# Patient Record
Sex: Female | Born: 1975 | Race: White | Hispanic: No | Marital: Married | Smoking: Former smoker
Health system: Southern US, Community
[De-identification: ages and names within clinical notes are randomized; demographics above are authoritative.]

## PROBLEM LIST (undated history)

## (undated) DIAGNOSIS — N809 Endometriosis, unspecified: Secondary | ICD-10-CM

## (undated) HISTORY — DX: Endometriosis, unspecified: N80.9

---

## 2007-03-25 ENCOUNTER — Ambulatory Visit: Payer: Self-pay | Admitting: Unknown Physician Specialty

## 2007-04-01 ENCOUNTER — Ambulatory Visit: Payer: Self-pay | Admitting: Unknown Physician Specialty

## 2007-04-07 ENCOUNTER — Ambulatory Visit: Payer: Self-pay | Admitting: Unknown Physician Specialty

## 2007-12-01 ENCOUNTER — Ambulatory Visit: Payer: Self-pay | Admitting: Unknown Physician Specialty

## 2008-06-08 HISTORY — PX: ABDOMINAL HYSTERECTOMY: SHX81

## 2009-03-27 ENCOUNTER — Ambulatory Visit: Payer: Self-pay | Admitting: Unknown Physician Specialty

## 2009-04-03 ENCOUNTER — Ambulatory Visit: Payer: Self-pay | Admitting: Unknown Physician Specialty

## 2010-06-08 LAB — HM MAMMOGRAPHY: HM Mammogram: NORMAL

## 2011-08-10 ENCOUNTER — Ambulatory Visit: Payer: Self-pay | Admitting: Internal Medicine

## 2011-08-14 ENCOUNTER — Emergency Department: Payer: Self-pay | Admitting: Emergency Medicine

## 2011-08-14 LAB — CBC WITH DIFFERENTIAL/PLATELET
Basophil %: 0.3 %
Eosinophil #: 0.1 10*3/uL (ref 0.0–0.7)
HGB: 13.6 g/dL (ref 12.0–16.0)
MCH: 31.7 pg (ref 26.0–34.0)
MCHC: 34.8 g/dL (ref 32.0–36.0)
MCV: 91 fL (ref 80–100)
Monocyte #: 0.4 10*3/uL (ref 0.0–0.7)
Neutrophil %: 52.6 %
Platelet: 284 10*3/uL (ref 150–440)

## 2011-08-14 LAB — COMPREHENSIVE METABOLIC PANEL
Alkaline Phosphatase: 94 U/L (ref 50–136)
BUN: 9 mg/dL (ref 7–18)
Bilirubin,Total: 1.1 mg/dL — ABNORMAL HIGH (ref 0.2–1.0)
Chloride: 105 mmol/L (ref 98–107)
Creatinine: 0.78 mg/dL (ref 0.60–1.30)
EGFR (African American): >60
Glucose: 88 mg/dL (ref 65–99)
Osmolality: 281 (ref 275–301)
SGPT (ALT): 62 U/L
Sodium: 142 mmol/L (ref 136–145)
Total Protein: 7.3 g/dL (ref 6.4–8.2)

## 2011-08-15 LAB — URINALYSIS, COMPLETE
Bacteria: NONE SEEN
Bilirubin,UR: NEGATIVE
Glucose,UR: NEGATIVE mg/dL (ref 0–75)
Ketone: NEGATIVE
Nitrite: NEGATIVE
Ph: 6 (ref 4.5–8.0)
RBC,UR: 2 /HPF (ref 0–5)
Squamous Epithelial: 4
WBC UR: 3 /HPF (ref 0–5)

## 2011-10-12 ENCOUNTER — Ambulatory Visit: Payer: Self-pay | Admitting: Gastroenterology

## 2013-01-17 ENCOUNTER — Ambulatory Visit: Payer: Self-pay | Admitting: Family Medicine

## 2013-01-17 LAB — CBC WITH DIFFERENTIAL/PLATELET
Basophil #: 0 10*3/uL (ref 0.0–0.1)
Eosinophil #: 0.1 10*3/uL (ref 0.0–0.7)
Eosinophil %: 1.3 %
HCT: 41.1 % (ref 35.0–47.0)
HGB: 14.3 g/dL (ref 12.0–16.0)
MCH: 31.4 pg (ref 26.0–34.0)
MCHC: 34.7 g/dL (ref 32.0–36.0)
MCV: 90 fL (ref 80–100)
Monocyte #: 0.3 x10 3/mm (ref 0.2–0.9)
Monocyte %: 4.7 %
Neutrophil #: 3.8 10*3/uL (ref 1.4–6.5)
Neutrophil %: 59.1 %
Platelet: 309 10*3/uL (ref 150–440)
WBC: 6.4 10*3/uL (ref 3.6–11.0)

## 2013-01-17 LAB — COMPREHENSIVE METABOLIC PANEL
Albumin: 4.5 g/dL (ref 3.4–5.0)
Anion Gap: 7 (ref 7–16)
Bilirubin,Total: 1 mg/dL (ref 0.2–1.0)
Chloride: 103 mmol/L (ref 98–107)
EGFR (African American): >60
Glucose: 101 mg/dL — ABNORMAL HIGH (ref 65–99)
Osmolality: 279 (ref 275–301)
Potassium: 4.4 mmol/L (ref 3.5–5.1)
SGOT(AST): 24 U/L (ref 15–37)
SGPT (ALT): 59 U/L (ref 12–78)
Total Protein: 8 g/dL (ref 6.4–8.2)

## 2013-01-17 LAB — URINALYSIS, COMPLETE
Bacteria: NEGATIVE
Bilirubin,UR: NEGATIVE
Blood: NEGATIVE
Glucose,UR: NEGATIVE mg/dL (ref 0–75)
Ketone: NEGATIVE
Nitrite: NEGATIVE

## 2013-01-17 LAB — AMYLASE: Amylase: 30 U/L (ref 25–115)

## 2013-01-17 LAB — LIPASE, BLOOD: Lipase: 87 U/L (ref 73–393)

## 2014-06-11 ENCOUNTER — Ambulatory Visit: Payer: Self-pay | Admitting: Physician Assistant

## 2014-09-05 LAB — HEMOGLOBIN A1C: Hgb A1c MFr Bld: 5.8 % (ref 4.0–6.0)

## 2014-09-27 ENCOUNTER — Encounter: Payer: Self-pay | Admitting: Internal Medicine

## 2014-09-27 DIAGNOSIS — E8940 Asymptomatic postprocedural ovarian failure: Secondary | ICD-10-CM | POA: Insufficient documentation

## 2014-09-27 DIAGNOSIS — F17201 Nicotine dependence, unspecified, in remission: Secondary | ICD-10-CM | POA: Insufficient documentation

## 2014-09-27 DIAGNOSIS — R7303 Prediabetes: Secondary | ICD-10-CM | POA: Insufficient documentation

## 2014-09-27 HISTORY — DX: Asymptomatic postprocedural ovarian failure: E89.40

## 2014-11-27 ENCOUNTER — Ambulatory Visit (INDEPENDENT_AMBULATORY_CARE_PROVIDER_SITE_OTHER): Payer: BLUE CROSS/BLUE SHIELD | Admitting: Internal Medicine

## 2014-11-27 ENCOUNTER — Other Ambulatory Visit: Payer: Self-pay

## 2014-11-27 ENCOUNTER — Encounter: Payer: Self-pay | Admitting: Internal Medicine

## 2014-11-27 VITALS — BP 110/70 | HR 72 | Ht 66.0 in | Wt 254.4 lb

## 2014-11-27 DIAGNOSIS — R7309 Other abnormal glucose: Secondary | ICD-10-CM

## 2014-11-27 DIAGNOSIS — Z0000 Encounter for general adult medical examination without abnormal findings: Secondary | ICD-10-CM

## 2014-11-27 DIAGNOSIS — R7303 Prediabetes: Secondary | ICD-10-CM

## 2014-11-27 DIAGNOSIS — E8940 Asymptomatic postprocedural ovarian failure: Secondary | ICD-10-CM | POA: Diagnosis not present

## 2014-11-27 DIAGNOSIS — Z1211 Encounter for screening for malignant neoplasm of colon: Secondary | ICD-10-CM | POA: Diagnosis not present

## 2014-11-27 LAB — POCT URINALYSIS DIPSTICK
Bilirubin, UA: NEGATIVE
Blood, UA: NEGATIVE
Glucose, UA: NEGATIVE
Ketones, UA: NEGATIVE
Leukocytes, UA: NEGATIVE
Nitrite, UA: NEGATIVE
Protein, UA: NEGATIVE
Spec Grav, UA: <=1.005
Urobilinogen, UA: 0.2
pH, UA: 5

## 2014-11-27 MED ORDER — NAPROXEN 250 MG PO TABS: 250.0000 mg | ORAL_TABLET | Freq: Two times a day (BID) | ORAL | Status: DC | PRN

## 2014-11-27 NOTE — Patient Instructions (Signed)

## 2014-11-27 NOTE — Progress Notes (Signed)
Date:  11/27/2014   Name:  Amber Adkins   DOB:  12-10-1975   MRN:  [id number]   Chief Complaint: Annual Exam and Diabetes Amber Adkins is a 39 y.o. female who presents today for her Complete Annual Exam. She feels well. She reports exercising -going to the gym regularly. She reports she is sleeping fairly well. She denies any breast symptoms but is not doing self exams.   Review of Systems:  Review of Systems  Patient Active Problem List   Diagnosis Date Noted  . Borderline diabetes 09/27/2014  . 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 Current tobacco use 09/27/2014    Prior to Admission medications   Medication Sig Start Date End Date Taking? Authorizing Provider  B-D UF III MINI PEN NEEDLES 31G X 5 MM MISC USE DAILY WITH VICTOZA 10/02/14  Yes Historical Provider, MD  estradiol (ESTRACE) 1 MG tablet Take 1 tablet by mouth daily. 09/17/14  Yes Historical Provider, MD  Liraglutide 18 MG/3ML SOPN Inject 1.8 mg into the skin daily. 09/17/14  Yes Historical Provider, MD  naproxen (NAPROSYN) 250 MG tablet Take 1 tablet by mouth 2 (two) times daily as needed.   Yes Historical Provider, MD    No Known Allergies  Past Surgical History  Procedure Laterality Date  . Abdominal hysterectomy  2010    total    History  Substance Use Topics  . Smoking status: Former Smoker -- 2.00 packs/day for 15 years    Types: Cigarettes    Quit date: 06/08/2013  . Smokeless tobacco: Not on file  . Alcohol Use: 0.0 oz/week    0 Standard drinks or equivalent per week     Comment: social     Medication list has been reviewed and updated.  Physical Examination:  Physical Exam  Constitutional: She is oriented to person, place, and time. She appears well-developed. No distress.  HENT:  Head: Normocephalic and atraumatic.  Eyes: Conjunctivae are normal. Right eye exhibits no discharge. Left eye exhibits no discharge. No scleral icterus.  Neck: Normal range of motion. Neck  supple. No thyromegaly present.  Cardiovascular: Normal rate, regular rhythm, normal heart sounds and intact distal pulses.   No murmur heard. Pulmonary/Chest: Effort normal and breath sounds normal. No respiratory distress. She has no wheezes.  Abdominal: Soft. Bowel sounds are normal. There is no tenderness. There is no rebound and no guarding.  Genitourinary: No breast swelling, tenderness, discharge or bleeding. Pelvic exam was performed with patient supine.  Musculoskeletal: Normal range of motion. She exhibits edema (trace pre-tibial edema bilaterally). She exhibits no tenderness.  Lymphadenopathy:    She has no cervical adenopathy.  Neurological: She is alert and oriented to person, place, and time. She has normal reflexes.  Skin: Skin is warm and dry. No rash noted.  Psychiatric: She has a normal mood and affect. Her behavior is normal. Thought content normal.    BP 110/70 mmHg  Pulse 72  Ht  (1.676 m)  Wt 254 lb 6.4 oz (115.395 kg)  BMI 41.08 kg/m2  Assessment and Plan: 1. Annual physical exam Normal except for weight Discussed diet and exercise  Reduce sodium in diet to help reduce mild edema - POCT urinalysis dipstick - CBC with Differential/Platelet - TSH  2. Borderline diabetes Finish current supply of Victoza then discontinue - Comprehensive metabolic panel - Hemoglobin A1c - Lipid panel  3. Failed, ovarian, postablative On HRT with control of symptoms  4. Colon cancer screening - Fecal  Occult Blood, Guaiac   Bari Edward, MD [facility]  11/27/2014

## 2014-12-03 ENCOUNTER — Telehealth: Payer: Self-pay | Admitting: Internal Medicine

## 2014-12-03 NOTE — Telephone Encounter (Signed)
Left message to call.dr 

## 2014-12-12 ENCOUNTER — Other Ambulatory Visit: Payer: Self-pay | Admitting: Internal Medicine

## 2014-12-12 DIAGNOSIS — E119 Type 2 diabetes mellitus without complications: Secondary | ICD-10-CM

## 2014-12-13 ENCOUNTER — Other Ambulatory Visit: Payer: Self-pay

## 2014-12-13 DIAGNOSIS — E119 Type 2 diabetes mellitus without complications: Secondary | ICD-10-CM

## 2014-12-14 LAB — CBC WITH DIFFERENTIAL/PLATELET
Basophils Absolute: 0 10*3/uL (ref 0.0–0.2)
Basos: 0 %
EOS (ABSOLUTE): 0.1 10*3/uL (ref 0.0–0.4)
EOS: 1 %
HEMOGLOBIN: 12.9 g/dL (ref 11.1–15.9)
Hematocrit: 38.7 % (ref 34.0–46.6)
IMMATURE GRANS (ABS): 0 10*3/uL (ref 0.0–0.1)
Immature Granulocytes: 0 %
LYMPHS ABS: 1.9 10*3/uL (ref 0.7–3.1)
Lymphs: 32 %
MCH: 30.4 pg (ref 26.6–33.0)
MCHC: 33.3 g/dL (ref 31.5–35.7)
MCV: 91 fL (ref 79–97)
Monocytes Absolute: 0.4 10*3/uL (ref 0.1–0.9)
Monocytes: 6 %
NEUTROS ABS: 3.7 10*3/uL (ref 1.4–7.0)
NEUTROS PCT: 61 %
Platelets: 295 10*3/uL (ref 150–379)
RBC: 4.25 x10E6/uL (ref 3.77–5.28)
RDW: 13.5 % (ref 12.3–15.4)
WBC: 6 10*3/uL (ref 3.4–10.8)

## 2014-12-14 LAB — COMPREHENSIVE METABOLIC PANEL
A/G RATIO: 1.9 (ref 1.1–2.5)
ALBUMIN: 4.2 g/dL (ref 3.5–5.5)
ALT: 30 IU/L (ref 0–32)
AST: 15 IU/L (ref 0–40)
Alkaline Phosphatase: 71 IU/L (ref 39–117)
BUN/Creatinine Ratio: 20 (ref 8–20)
BUN: 14 mg/dL (ref 6–20)
Bilirubin Total: 1 mg/dL (ref 0.0–1.2)
CALCIUM: 9.4 mg/dL (ref 8.7–10.2)
CO2: 26 mmol/L (ref 18–29)
Chloride: 101 mmol/L (ref 97–108)
Creatinine, Ser: 0.71 mg/dL (ref 0.57–1.00)
GFR calc Af Amer: 125 mL/min/{1.73_m2} (ref >59)
GFR calc non Af Amer: 108 mL/min/{1.73_m2} (ref >59)
GLUCOSE: 107 mg/dL — AB (ref 65–99)
Globulin, Total: 2.2 g/dL (ref 1.5–4.5)
Potassium: 4.2 mmol/L (ref 3.5–5.2)
Sodium: 140 mmol/L (ref 134–144)
TOTAL PROTEIN: 6.4 g/dL (ref 6.0–8.5)

## 2014-12-14 LAB — HEMOGLOBIN A1C
Est. average glucose Bld gHb Est-mCnc: 120 mg/dL
Hgb A1c MFr Bld: 5.8 % — ABNORMAL HIGH (ref 4.8–5.6)

## 2014-12-14 LAB — LIPID PANEL
Chol/HDL Ratio: 4.8 ratio units — ABNORMAL HIGH (ref 0.0–4.4)
Cholesterol, Total: 218 mg/dL — ABNORMAL HIGH (ref 100–199)
HDL: 45 mg/dL (ref >39)
LDL Calculated: 122 mg/dL — ABNORMAL HIGH (ref 0–99)
Triglycerides: 256 mg/dL — ABNORMAL HIGH (ref 0–149)
VLDL Cholesterol Cal: 51 mg/dL — ABNORMAL HIGH (ref 5–40)

## 2014-12-14 LAB — TSH: TSH: 1.16 u[IU]/mL (ref 0.450–4.500)

## 2015-02-18 ENCOUNTER — Ambulatory Visit (INDEPENDENT_AMBULATORY_CARE_PROVIDER_SITE_OTHER): Payer: BLUE CROSS/BLUE SHIELD | Admitting: Internal Medicine

## 2015-02-18 ENCOUNTER — Encounter: Payer: Self-pay | Admitting: Internal Medicine

## 2015-02-18 VITALS — BP 130/68 | HR 80 | Ht 66.0 in | Wt 257.0 lb

## 2015-02-18 DIAGNOSIS — E8940 Asymptomatic postprocedural ovarian failure: Secondary | ICD-10-CM

## 2015-02-18 DIAGNOSIS — Z720 Tobacco use: Secondary | ICD-10-CM

## 2015-02-18 DIAGNOSIS — Z6841 Body Mass Index (BMI) 40.0 and over, adult: Secondary | ICD-10-CM | POA: Diagnosis not present

## 2015-02-18 DIAGNOSIS — R7303 Prediabetes: Secondary | ICD-10-CM

## 2015-02-18 DIAGNOSIS — R7309 Other abnormal glucose: Secondary | ICD-10-CM

## 2015-02-18 DIAGNOSIS — F17201 Nicotine dependence, unspecified, in remission: Secondary | ICD-10-CM

## 2015-02-18 NOTE — Patient Instructions (Signed)
Recommend Bariatric surgery seminar for options for surgery.

## 2015-02-18 NOTE — Progress Notes (Signed)
Date:  02/18/2015   Name:  Amber Adkins   DOB:  Feb 20, 1976   MRN:  [id number]   Chief Complaint: Diabetes  prediabetes -  Patient was treated with thick toes that help with prediabetes and weight loss. She discontinued it after several months when she didn't have any benefit..  Obesity -  Patient is very frustrated with her inability to lose weight. She did quit smoking gained about 30 pounds. Immediately began exercising and eating healthy cutting out sodas both regular and diet. Regardless she's not been able to lose any weight. She is considering bariatric clinic for phentermine and hCG injections. However she is not excited about this possibility and is wondering if she should proceed to bariatric surgery.  Hormone replacement therapy -  Patient had a total hysterectomy at a young age and has been on estrogen since that time. She's been edgy and unhappy for the past 6 months and she believes this might be due to hormone treatment. Is wondering about the different types of estrogen therapy. She also believes a lot of her emotional issues are tied to her inability to lose weight.  Review of Systems:  Review of Systems  Constitutional: Positive for fatigue. Negative for fever, chills, diaphoresis and unexpected weight change.  Respiratory: Negative for choking, chest tightness and shortness of breath.   Cardiovascular: Negative for chest pain, palpitations and leg swelling.  Gastrointestinal: Negative for abdominal pain.  Endocrine: Negative for polyphagia and polyuria.  Neurological: Negative for headaches.  Psychiatric/Behavioral: Positive for dysphoric mood. Negative for sleep disturbance and self-injury.    Patient Active Problem List   Diagnosis Date Noted  . Borderline diabetes 09/27/2014  . 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 Current tobacco use 09/27/2014    Prior to Admission medications   Medication Sig Start Date End Date Taking? Authorizing Provider   estradiol (ESTRACE) 1 MG tablet Take 1 tablet by mouth daily. 09/17/14  Yes Historical Provider, MD  naproxen (NAPROSYN) 250 MG tablet Take 1 tablet (250 mg total) by mouth 2 (two) times daily as needed. 11/27/14  Yes Reubin Milan, MD  B-D UF III MINI PEN NEEDLES 31G X 5 MM MISC USE DAILY WITH VICTOZA 10/02/14   Historical Provider, MD  Liraglutide 18 MG/3ML SOPN Inject 1.8 mg into the skin daily. 09/17/14   Historical Provider, MD    No Known Allergies  Past Surgical History  Procedure Laterality Date  . Abdominal hysterectomy  2010    total    Social History  Substance Use Topics  . Smoking status: Former Smoker -- 2.00 packs/day for 15 years    Types: Cigarettes    Quit date: 06/08/2013  . Smokeless tobacco: None  . Alcohol Use: 0.0 oz/week    0 Standard drinks or equivalent per week     Comment: social     Medication list has been reviewed and updated.  Physical Examination:  Physical Exam  Constitutional: She appears well-developed and well-nourished.  Neck: Normal range of motion. Neck supple.  Cardiovascular: Normal rate, regular rhythm and normal heart sounds.   Pulmonary/Chest: Effort normal and breath sounds normal. No respiratory distress. She has no wheezes.  Musculoskeletal: She exhibits no edema or tenderness.  Psychiatric: Her mood appears anxious ( and tearful).  Nursing note and vitals reviewed.   BP 130/68 mmHg  Pulse 80  Ht  (1.676 m)  Wt 257 lb (116.574 kg)  BMI 41.50 kg/m2  Assessment and Plan: 1. Borderline diabetes  will  continue to monitor and intervals  Remain off of Victoza  2. Failed, ovarian, postablative  continue estrogen replacement therapy  3. Tobacco use disorder, mild, in sustained remission  patient congratulated on remaining tobacco free  4. BMI 40.0-44.9, adult  consider bariatric surgery with the lap band  Patient is encouraged to go to seminars and educate herself   Bari Edward, MD [facility]  02/18/2015

## 2015-03-11 ENCOUNTER — Ambulatory Visit (INDEPENDENT_AMBULATORY_CARE_PROVIDER_SITE_OTHER): Payer: BLUE CROSS/BLUE SHIELD | Admitting: Internal Medicine

## 2015-03-11 ENCOUNTER — Encounter: Payer: Self-pay | Admitting: Internal Medicine

## 2015-03-11 VITALS — BP 100/70 | HR 90 | Temp 98.1°F | Ht 66.0 in | Wt 255.4 lb

## 2015-03-11 DIAGNOSIS — J40 Bronchitis, not specified as acute or chronic: Secondary | ICD-10-CM

## 2015-03-11 DIAGNOSIS — Z6841 Body Mass Index (BMI) 40.0 and over, adult: Secondary | ICD-10-CM | POA: Diagnosis not present

## 2015-03-11 MED ORDER — DOXYCYCLINE HYCLATE 100 MG PO TABS: 100.0000 mg | ORAL_TABLET | Freq: Two times a day (BID) | ORAL | Status: DC

## 2015-03-11 MED ORDER — HYDROCODONE-HOMATROPINE 5-1.5 MG/5ML PO SYRP: 5.0000 mL | ORAL_SOLUTION | Freq: Four times a day (QID) | ORAL | Status: DC | PRN

## 2015-03-11 NOTE — Progress Notes (Signed)
Date:  03/11/2015   Name:  Amber Adkins   DOB:  [birth year]   MRN:  [id number]   Chief Complaint: URI URI  This is a new problem. The current episode started in the past 7 days. The problem has been gradually worsening. There has been no fever. Associated symptoms include coughing, rhinorrhea, sinus pain, a sore throat and swollen glands. Pertinent negatives include no chest pain, ear pain, headaches or wheezing. She has tried decongestant for the symptoms. The treatment provided mild relief.     Review of Systems:  Review of Systems  Constitutional: Negative for fever, chills and fatigue.  HENT: Positive for rhinorrhea and sore throat. Negative for ear pain, sinus pressure and trouble swallowing.   Respiratory: Positive for cough and chest tightness. Negative for wheezing.   Cardiovascular: Negative for chest pain and palpitations.  Neurological: Negative for dizziness and headaches.    Patient Active Problem List   Diagnosis Date Noted  . Borderline diabetes 09/27/2014  . 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 Tobacco use disorder, mild, in sustained remission 09/27/2014    Prior to Admission medications   Medication Sig Start Date End Date Taking? Authorizing Provider  estradiol (ESTRACE) 1 MG tablet Take 1 tablet by mouth daily. 09/17/14  Yes Historical Provider, MD  naproxen (NAPROSYN) 250 MG tablet Take 1 tablet (250 mg total) by mouth 2 (two) times daily as needed. 11/27/14  Yes Reubin Milan, MD    No Known Allergies  Past Surgical History  Procedure Laterality Date  . Abdominal hysterectomy  2010    total    Social History  Substance Use Topics  . Smoking status: Former Smoker -- 2.00 packs/day for 15 years    Types: Cigarettes    Quit date: 06/08/2013  . Smokeless tobacco: None  . Alcohol Use: 0.0 oz/week    0 Standard drinks or equivalent per week     Comment: social     Medication list has been reviewed and updated.  Physical  Examination:  Physical Exam  Constitutional: She appears well-developed and well-nourished.  HENT:  Right Ear: Tympanic membrane and ear canal normal.  Left Ear: Tympanic membrane and ear canal normal.  Nose: Right sinus exhibits no maxillary sinus tenderness. Left sinus exhibits no maxillary sinus tenderness.  Mouth/Throat: Posterior oropharyngeal erythema present. No oropharyngeal exudate or posterior oropharyngeal edema.  Neck: Carotid bruit is not present.  Cardiovascular: Normal rate, regular rhythm and normal heart sounds.   Pulmonary/Chest: She has decreased breath sounds in the right upper field and the left upper field. She has no wheezes. She has no rhonchi.  Psychiatric: She has a normal mood and affect.  Nursing note and vitals reviewed.   BP 100/70 mmHg  Pulse 90  Temp(Src) 98.1 F (36.7 C)  Ht  (1.676 m)  Wt 255 lb 6.4 oz (115.849 kg)  BMI 41.24 kg/m2  SpO2 98%  Assessment and Plan: 1. Bronchitis Continue Mucinex and fluids  Activity as tolerated - HYDROcodone-homatropine (HYCODAN) 5-1.5 MG/5ML syrup; Take 5 mLs by mouth every 6 (six) hours as needed for cough.  Dispense: 240 mL; Refill: 0 - doxycycline (VIBRA-TABS) 100 MG tablet; Take 1 tablet (100 mg total) by mouth 2 (two) times daily.  Dispense: 20 tablet; Refill: 0  2. BMI 40.0-44.9, adult [facility]) Patient has decided to pursue the gastric sleeve at [facility], MD [facility]  03/11/2015

## 2015-03-20 DIAGNOSIS — E785 Hyperlipidemia, unspecified: Secondary | ICD-10-CM

## 2015-03-20 DIAGNOSIS — E782 Mixed hyperlipidemia: Secondary | ICD-10-CM | POA: Insufficient documentation

## 2015-04-09 HISTORY — PX: LAPAROSCOPIC GASTRIC SLEEVE RESECTION: SHX5895

## 2015-04-24 DIAGNOSIS — Z9884 Bariatric surgery status: Secondary | ICD-10-CM | POA: Insufficient documentation

## 2015-07-23 DIAGNOSIS — K219 Gastro-esophageal reflux disease without esophagitis: Secondary | ICD-10-CM | POA: Insufficient documentation

## 2015-09-26 ENCOUNTER — Ambulatory Visit (INDEPENDENT_AMBULATORY_CARE_PROVIDER_SITE_OTHER): Payer: BLUE CROSS/BLUE SHIELD | Admitting: Internal Medicine

## 2015-09-26 ENCOUNTER — Other Ambulatory Visit: Payer: Self-pay | Admitting: Internal Medicine

## 2015-09-26 ENCOUNTER — Encounter: Payer: Self-pay | Admitting: Internal Medicine

## 2015-09-26 VITALS — BP 94/60 | HR 72 | Ht 66.0 in | Wt 189.0 lb

## 2015-09-26 DIAGNOSIS — Z9889 Other specified postprocedural states: Secondary | ICD-10-CM | POA: Diagnosis not present

## 2015-09-26 DIAGNOSIS — J358 Other chronic diseases of tonsils and adenoids: Secondary | ICD-10-CM | POA: Diagnosis not present

## 2015-09-26 DIAGNOSIS — E785 Hyperlipidemia, unspecified: Secondary | ICD-10-CM | POA: Diagnosis not present

## 2015-09-26 DIAGNOSIS — E8940 Asymptomatic postprocedural ovarian failure: Secondary | ICD-10-CM | POA: Diagnosis not present

## 2015-09-26 DIAGNOSIS — K219 Gastro-esophageal reflux disease without esophagitis: Secondary | ICD-10-CM

## 2015-09-26 MED ORDER — ESTRADIOL 1 MG PO TABS: 1.0000 mg | ORAL_TABLET | Freq: Every day | ORAL | Status: DC

## 2015-09-26 NOTE — Patient Instructions (Signed)
Breast Self-Awareness Practicing breast self-awareness may pick up problems early, prevent significant medical complications, and possibly save your life. By practicing breast self-awareness, you can become familiar with how your breasts look and feel and if your breasts are changing. This allows you to notice changes early. It can also offer you some reassurance that your breast health is good. One way to learn what is normal for your breasts and whether your breasts are changing is to do a breast self-exam. If you find a lump or something that was not present in the past, it is best to contact your caregiver right away. Other findings that should be evaluated by your caregiver include nipple discharge, especially if it is bloody; skin changes or reddening; areas where the skin seems to be pulled in (retracted); or new lumps and bumps. Breast pain is seldom associated with cancer (malignancy), but should also be evaluated by a caregiver. HOW TO PERFORM A BREAST SELF-EXAM The best time to examine your breasts is 5-7 days after your menstrual period is over. During menstruation, the breasts are lumpier, and it may be more difficult to pick up changes. If you do not menstruate, have reached menopause, or had your uterus removed (hysterectomy), you should examine your breasts at regular intervals, such as monthly. If you are breastfeeding, examine your breasts after a feeding or after using a breast pump. Breast implants do not decrease the risk for lumps or tumors, so continue to perform breast self-exams as recommended. Talk to your caregiver about how to determine the difference between the implant and breast tissue. Also, talk about the amount of pressure you should use during the exam. Over time, you will become more familiar with the variations of your breasts and more comfortable with the exam. A breast self-exam requires you to remove all your clothes above the waist. 1. Look at your breasts and nipples.  Stand in front of a mirror in a room with good lighting. With your hands on your hips, push your hands firmly downward. Look for a difference in shape, contour, and size from one breast to the other (asymmetry). Asymmetry includes puckers, dips, or bumps. Also, look for skin changes, such as reddened or scaly areas on the breasts. Look for nipple changes, such as discharge, dimpling, repositioning, or redness. 2. Carefully feel your breasts. This is best done either in the shower or tub while using soapy water or when flat on your back. Place the arm (on the side of the breast you are examining) above your head. Use the pads (not the fingertips) of your three middle fingers on your opposite hand to feel your breasts. Start in the underarm area and use  inch (2 cm) overlapping circles to feel your breast. Use 3 different levels of pressure (light, medium, and firm pressure) at each circle before moving to the next circle. The light pressure is needed to feel the tissue closest to the skin. The medium pressure will help to feel breast tissue a little deeper, while the firm pressure is needed to feel the tissue close to the ribs. Continue the overlapping circles, moving downward over the breast until you feel your ribs below your breast. Then, move one finger-width towards the center of the body. Continue to use the  inch (2 cm) overlapping circles to feel your breast as you move slowly up toward the collar bone (clavicle) near the base of the neck. Continue the up and down exam using all 3 pressures until you reach the   middle of the chest. Do this with each breast, carefully feeling for lumps or changes. 3.  Keep a written record with breast changes or normal findings for each breast. By writing this information down, you do not need to depend only on memory for size, tenderness, or location. Write down where you are in your menstrual cycle, if you are still menstruating. Breast tissue can have some lumps or  thick tissue. However, see your caregiver if you find anything that concerns you.  SEEK MEDICAL CARE IF:  You see a change in shape, contour, or size of your breasts or nipples.   You see skin changes, such as reddened or scaly areas on the breasts or nipples.   You have an unusual discharge from your nipples.   You feel a new lump or unusually thick areas.    This information is not intended to replace advice given to you by your health care provider. Make sure you discuss any questions you have with your health care provider.   Document Released: 05/25/2005 Document Revised: 05/11/2012 Document Reviewed: 09/09/2011 Elsevier Interactive Patient Education 2016 Elsevier Inc.  

## 2015-09-26 NOTE — Progress Notes (Signed)
Date:  09/26/2015   Name:  Amber Adkins   DOB:  07-18-1975   MRN:  [id number]  Patient is here for general follow up.  She had gastric sleeve in November 2016 and has done well.  Recent labs at [facility] are reviewed.  Chief Complaint: Follow-up and Sore Throat Sore Throat  This is a new problem. The problem has been unchanged. The pain is worse on the left side. There has been no fever. Pertinent negatives include no abdominal pain (frequent heartburn), coughing, diarrhea, ear pain, headaches, shortness of breath or trouble swallowing. She has tried nothing for the symptoms.  Gastroesophageal Reflux She complains of heartburn and a sore throat. She reports no abdominal pain (frequent heartburn), no chest pain, no coughing or no wheezing. This is a new problem. The current episode started more than 1 month ago. The problem occurs frequently. Pertinent negatives include no fatigue. She has tried a PPI and an antacid (takes 6 tums per day) for the symptoms.    Gastric Sleeve Bypass - done in November 2016.  Doing very well.  She has had some hair loss but has good energy. Overall has lost over 70 lbs and is very pleased.  /14/2017 04/25/2015 04/08/2015 03/20/2015    143 136  140  4.5 4.1  4.3  102 100  102  0.72 0.68  0.68  >=60 >=60  97  >=60 >=60  117  99 104  95  9.8 8.9  9.7  4.4  4.1 4.2  7.2   6.9  1.7 (H)   1.1  29   37  69 (H)   73 (H)  61      07/23/2015 03/20/2015     220 (H)  139 235 (H)   44   129   47   5.0   176     Review of Systems  Constitutional: Negative for fever, chills and fatigue.  HENT: Positive for sore throat. Negative for ear pain, hearing loss, trouble swallowing and voice change.   Eyes: Negative for visual disturbance.  Respiratory: Negative for cough, chest tightness, shortness of breath and wheezing.   Cardiovascular: Negative for chest pain, palpitations and leg swelling.    Gastrointestinal: Positive for heartburn. Negative for abdominal pain (frequent heartburn), diarrhea, constipation and blood in stool.  Genitourinary: Negative for dysuria, hematuria and menstrual problem.  Musculoskeletal: Negative for myalgias, joint swelling, arthralgias and gait problem.  Skin: Negative for rash.  Neurological: Negative for dizziness, tremors, numbness and headaches.  Hematological: Negative for adenopathy.  Psychiatric/Behavioral: Negative for sleep disturbance and dysphoric mood.    Patient Active Problem List   Diagnosis Date Noted  . Gastro-esophageal reflux disease without esophagitis 07/23/2015  . History of surgical procedure 04/24/2015  . HLD (hyperlipidemia) 03/20/2015  . BMI 40.0-44.9, adult (HCC) 03/11/2015  . Borderline diabetes 09/27/2014  . 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 Tobacco use disorder, mild, in sustained remission 09/27/2014    Prior to Admission medications   Medication Sig Start Date End Date Taking? Authorizing Provider  estradiol (ESTRACE) 1 MG tablet Take 1 tablet by mouth daily. 09/17/14  Yes Historical Provider, MD  omeprazole (PRILOSEC) 40 MG capsule Take 1 capsule by mouth 2 (two) times daily. 09/16/15  Yes Historical Provider, MD  ursodiol (ACTIGALL) 300 MG capsule Take 300 mg by mouth 2 (two) times daily.  Yes Historical Provider, MD    Allergies  Allergen Reactions  . Tape Hives    Surgical tape and surgical glue    Past Surgical History  Procedure Laterality Date  . Abdominal hysterectomy  2010    total  . 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 Smoking status: Former Smoker -- 2.00 packs/day for 15 years    Types: Cigarettes    Quit date: 06/08/2013  . Smokeless tobacco: None  . Alcohol Use: 0.0 oz/week    0 Standard drinks or equivalent per week     Comment: social    Medication list has been reviewed and updated.  Physical Exam   Constitutional: She is oriented to person, place, and time. She appears well-developed. No distress.  HENT:  Head: Normocephalic and atraumatic.  Right Ear: Tympanic membrane and ear canal normal.  Left Ear: Tympanic membrane and ear canal normal.  Nose: Nose normal. Right sinus exhibits no maxillary sinus tenderness. Left sinus exhibits no maxillary sinus tenderness.  Mouth/Throat: Uvula is midline and oropharynx is clear and moist.    Eyes: Pupils are equal, round, and reactive to light.  Neck: Normal range of motion. Neck supple.  Cardiovascular: Normal rate, regular rhythm and normal heart sounds.   Pulmonary/Chest: Effort normal and breath sounds normal. No respiratory distress. She has no wheezes. She has no rales.  Musculoskeletal: She exhibits no edema or tenderness.  Lymphadenopathy:    She has no cervical adenopathy.  Neurological: She is alert and oriented to person, place, and time.  Skin: Skin is warm and dry. No rash noted.  Psychiatric: She has a normal mood and affect. Her behavior is normal. Thought content normal.  Nursing note and vitals reviewed.   BP 94/60 mmHg  Pulse 72  Ht 5\' 6"  (1.676 m)  Wt 189 lb (85.73 kg)  BMI 30.52 kg/m2  Assessment and Plan: 1. History of surgical procedure Doing well s/p gastric sleeve with excellent weight loss Continue exercise and supplemented Labs reviewed - all WNL  2. Tonsil stone Recommend gargles as needed  3. HLD (hyperlipidemia) Triglycerides now normal.  4. Gastro-esophageal reflux disease without esophagitis Continue omeprazole - discuss worsening symptoms with Bariatrics  5. Failed, ovarian, postablative - estradiol (ESTRACE) 1 MG tablet; Take 1 tablet (1 mg total) by mouth daily.  Dispense: 30 tablet; Refill: 12   Bari EdwardLaura Berglund, MD [facility]  09/26/2015

## 2015-10-16 ENCOUNTER — Other Ambulatory Visit: Payer: Self-pay | Admitting: Internal Medicine

## 2016-01-16 ENCOUNTER — Encounter: Payer: Self-pay | Admitting: Internal Medicine

## 2016-01-16 ENCOUNTER — Encounter (INDEPENDENT_AMBULATORY_CARE_PROVIDER_SITE_OTHER): Payer: Self-pay

## 2016-01-16 ENCOUNTER — Ambulatory Visit (INDEPENDENT_AMBULATORY_CARE_PROVIDER_SITE_OTHER): Payer: BLUE CROSS/BLUE SHIELD | Admitting: Internal Medicine

## 2016-01-16 VITALS — BP 108/76 | HR 67 | Resp 16 | Ht 66.0 in | Wt 168.8 lb

## 2016-01-16 DIAGNOSIS — H109 Unspecified conjunctivitis: Secondary | ICD-10-CM | POA: Diagnosis not present

## 2016-01-16 MED ORDER — NEOMYCIN-POLYMYXIN-DEXAMETH 3.5-10000-0.1 OP SUSP: 2.0000 [drp] | Freq: Four times a day (QID) | OPHTHALMIC | 0 refills | Status: DC

## 2016-01-16 NOTE — Patient Instructions (Signed)
Allergic Conjunctivitis Allergic conjunctivitis is inflammation of the clear membrane that covers the white part of your eye and the inner surface of your eyelid (conjunctiva), and it is caused by allergies. The blood vessels in the conjunctiva become inflamed, and this causes the eye to become red or pink, and it often causes itchiness in the eye. Allergic conjunctivitis cannot be spread by one person to another person (noncontagious). CAUSES This condition is caused by an allergic reaction. Common causes of an allergic reaction (allergens) include:  Dust.  Pollen.  Mold.  Animal dander or secretions. RISK FACTORS This condition is more likely to develop if you are exposed to high levels of allergens that cause the allergic reaction. This might include being outdoors when air pollen levels are high or being around animals that you are allergic to. SYMPTOMS Symptoms of this condition may include:  Eye redness.  Tearing of the eyes.  Watery eyes.  Itchy eyes.  Burning feeling in the eyes.  Clear drainage from the eyes.  Swollen eyelids. DIAGNOSIS This condition may be diagnosed by medical history and physical exam. If you have drainage from your eyes, it may be tested to rule out other causes of conjunctivitis. TREATMENT Treatment for this condition often includes medicines. These may be eye drops, ointments, or oral medicines. They may be prescription medicines or over-the-counter medicines. HOME CARE INSTRUCTIONS  Take or apply medicines only as directed by your health care provider.  Do not touch or rub your eyes.  Do not wear contact lenses until the inflammation is gone. Wear glasses instead.  Do not wear eye makeup until the inflammation is gone.  Apply a cool, clean washcloth to your eye for 10-20 minutes, 3-4 times a day.  Try to avoid whatever allergen is causing the allergic reaction. SEEK MEDICAL CARE IF:  Your symptoms get worse.  You have pus draining  from your eye.  You have new symptoms.  You have a fever.   This information is not intended to replace advice given to you by your health care provider. Make sure you discuss any questions you have with your health care provider.   Document Released: 08/15/2002 Document Revised: 06/15/2014 Document Reviewed: 03/06/2014 Elsevier Interactive Patient Education 2016 Elsevier Inc.  

## 2016-01-16 NOTE — Progress Notes (Signed)
Date:  01/16/2016   Name:  Amber Adkins   DOB:  [birth year]   MRN:  [id number]   Chief Complaint: Conjunctivitis (Has crust and redness with pain in right eye. Check ears and eyes for trip to mountains. )  Conjunctivitis   The current episode started 2 days ago. The onset was sudden. The problem has been unchanged. The problem is moderate. Associated symptoms include congestion, rhinorrhea, eye pain and eye redness. Pertinent negatives include no fever, no ear discharge and no eye discharge.     Review of Systems  Constitutional: Negative for fatigue and fever.  HENT: Positive for congestion and rhinorrhea. Negative for ear discharge.   Eyes: Positive for pain and redness. Negative for discharge.  Respiratory: Negative for chest tightness and shortness of breath.   Cardiovascular: Negative for chest pain.    Patient Active Problem List   Diagnosis Date Noted  . Gastro-esophageal reflux disease without esophagitis 07/23/2015  . History of surgical procedure 04/24/2015  . HLD (hyperlipidemia) 03/20/2015  . 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 Tobacco use disorder, mild, in sustained remission 09/27/2014    Prior to Admission medications   Medication Sig Start Date End Date Taking? Authorizing Provider  acetaminophen (TYLENOL) 500 MG tablet Take 1,000 mg by mouth.   Yes Historical Provider, MD  estradiol (ESTRACE) 1 MG tablet TAKE 1 TABLET BY MOUTH EVERY DAY 10/16/15  Yes Reubin Milan, MD  omeprazole (PRILOSEC) 40 MG capsule Take 40 mg by mouth. 07/23/15 07/22/16 Yes Historical Provider, MD    Allergies  Allergen Reactions  . Tape Hives    Surgical tape and surgical glue    Past Surgical History:  Procedure Laterality Date  . ABDOMINAL HYSTERECTOMY  2010   total  . LAPAROSCOPIC GASTRIC SLEEVE RESECTION  04/2015    Social History  Substance Use Topics  . Smoking status: Former Smoker    Packs/day: 2.00    Years: 15.00    Types: Cigarettes   Quit date: 06/08/2013  . Smokeless tobacco: Not on file  . Alcohol use 0.0 oz/week     Comment: social     Medication list has been reviewed and updated.   Physical Exam  Constitutional: She is oriented to person, place, and time. She appears well-developed. No distress.  HENT:  Head: Normocephalic and atraumatic.  Right Ear: Tympanic membrane and ear canal normal.  Left Ear: Tympanic membrane and ear canal normal.  Mouth/Throat: Oropharynx is clear and moist.  Eyes: Right eye exhibits chemosis. Left eye exhibits no chemosis. Right conjunctiva is injected. Left conjunctiva is not injected.  Cardiovascular: Normal rate and regular rhythm.   Pulmonary/Chest: Effort normal and breath sounds normal. No respiratory distress.  Musculoskeletal: Normal range of motion.  Lymphadenopathy:    She has no cervical adenopathy.  Neurological: She is alert and oriented to person, place, and time.  Skin: Skin is warm and dry. No rash noted.  Psychiatric: She has a normal mood and affect. Her behavior is normal. Thought content normal.    BP 108/76 (BP Location: Right Arm, Patient Position: Sitting, Cuff Size: Normal)   Pulse 67   Resp 16   Ht  (1.676 m)   Wt 168 lb 12.8 oz (76.6 kg)   SpO2 100%   BMI 27.25 kg/m   Assessment and Plan: 1. Conjunctivitis of right eye Begin Zyrtec daily for 5-7 days - neomycin-polymyxin b-dexamethasone (MAXITROL) 3.5-10000-0.1 SUSP; Place 2 drops into both eyes every 6 (six) hours.  Dispense: 5 mL; Refill: 0   Bari EdwardLaura Hannah Strader, MD [facility]  01/16/2016

## 2016-02-20 DIAGNOSIS — R109 Unspecified abdominal pain: Secondary | ICD-10-CM | POA: Diagnosis not present

## 2016-02-20 DIAGNOSIS — K8020 Calculus of gallbladder without cholecystitis without obstruction: Secondary | ICD-10-CM | POA: Diagnosis not present

## 2016-02-20 DIAGNOSIS — K219 Gastro-esophageal reflux disease without esophagitis: Secondary | ICD-10-CM | POA: Diagnosis not present

## 2016-02-24 DIAGNOSIS — Z6826 Body mass index (BMI) 26.0-26.9, adult: Secondary | ICD-10-CM | POA: Diagnosis not present

## 2016-02-24 DIAGNOSIS — Z903 Acquired absence of stomach [part of]: Secondary | ICD-10-CM | POA: Diagnosis not present

## 2016-02-24 DIAGNOSIS — R1011 Right upper quadrant pain: Secondary | ICD-10-CM | POA: Diagnosis not present

## 2016-02-25 DIAGNOSIS — R1011 Right upper quadrant pain: Secondary | ICD-10-CM | POA: Diagnosis not present

## 2016-02-25 DIAGNOSIS — Z903 Acquired absence of stomach [part of]: Secondary | ICD-10-CM | POA: Diagnosis not present

## 2016-03-04 DIAGNOSIS — Z6826 Body mass index (BMI) 26.0-26.9, adult: Secondary | ICD-10-CM | POA: Diagnosis not present

## 2016-03-04 DIAGNOSIS — K660 Peritoneal adhesions (postprocedural) (postinfection): Secondary | ICD-10-CM | POA: Diagnosis not present

## 2016-03-04 DIAGNOSIS — K8020 Calculus of gallbladder without cholecystitis without obstruction: Secondary | ICD-10-CM | POA: Diagnosis not present

## 2016-03-04 DIAGNOSIS — K219 Gastro-esophageal reflux disease without esophagitis: Secondary | ICD-10-CM | POA: Diagnosis not present

## 2016-03-04 DIAGNOSIS — K824 Cholesterolosis of gallbladder: Secondary | ICD-10-CM | POA: Diagnosis not present

## 2016-03-04 DIAGNOSIS — Z79899 Other long term (current) drug therapy: Secondary | ICD-10-CM | POA: Diagnosis not present

## 2016-03-04 DIAGNOSIS — E785 Hyperlipidemia, unspecified: Secondary | ICD-10-CM | POA: Diagnosis not present

## 2016-03-04 DIAGNOSIS — Z87891 Personal history of nicotine dependence: Secondary | ICD-10-CM | POA: Diagnosis not present

## 2016-03-04 DIAGNOSIS — E669 Obesity, unspecified: Secondary | ICD-10-CM | POA: Diagnosis not present

## 2016-03-04 DIAGNOSIS — Z9884 Bariatric surgery status: Secondary | ICD-10-CM | POA: Diagnosis not present

## 2016-03-04 DIAGNOSIS — Z9109 Other allergy status, other than to drugs and biological substances: Secondary | ICD-10-CM | POA: Diagnosis not present

## 2016-03-04 DIAGNOSIS — E039 Hypothyroidism, unspecified: Secondary | ICD-10-CM | POA: Diagnosis not present

## 2016-03-04 DIAGNOSIS — K8010 Calculus of gallbladder with chronic cholecystitis without obstruction: Secondary | ICD-10-CM | POA: Diagnosis not present

## 2016-03-04 HISTORY — PX: CHOLECYSTECTOMY, LAPAROSCOPIC: SHX56

## 2016-03-05 ENCOUNTER — Encounter: Payer: Self-pay | Admitting: Internal Medicine

## 2016-03-23 DIAGNOSIS — Z9049 Acquired absence of other specified parts of digestive tract: Secondary | ICD-10-CM | POA: Diagnosis not present

## 2016-05-08 DIAGNOSIS — Z6825 Body mass index (BMI) 25.0-25.9, adult: Secondary | ICD-10-CM | POA: Diagnosis not present

## 2016-05-08 DIAGNOSIS — Z903 Acquired absence of stomach [part of]: Secondary | ICD-10-CM | POA: Diagnosis not present

## 2016-05-08 DIAGNOSIS — Z713 Dietary counseling and surveillance: Secondary | ICD-10-CM | POA: Diagnosis not present

## 2016-05-08 DIAGNOSIS — Z789 Other specified health status: Secondary | ICD-10-CM | POA: Diagnosis not present

## 2016-06-23 ENCOUNTER — Telehealth: Payer: Self-pay | Admitting: Internal Medicine

## 2016-06-23 ENCOUNTER — Other Ambulatory Visit: Payer: Self-pay | Admitting: Internal Medicine

## 2016-06-23 MED ORDER — OSELTAMIVIR PHOSPHATE 75 MG PO CAPS: 75.0000 mg | ORAL_CAPSULE | Freq: Two times a day (BID) | ORAL | 0 refills | Status: DC

## 2016-06-23 NOTE — Telephone Encounter (Signed)
Pt called stated her daughter been diagnosed today for flu and need preventive medication for [facility] [place]

## 2016-06-23 NOTE — Telephone Encounter (Signed)
Done

## 2016-09-22 DIAGNOSIS — R110 Nausea: Secondary | ICD-10-CM | POA: Diagnosis not present

## 2016-09-22 DIAGNOSIS — R748 Abnormal levels of other serum enzymes: Secondary | ICD-10-CM | POA: Diagnosis not present

## 2016-09-22 DIAGNOSIS — R1013 Epigastric pain: Secondary | ICD-10-CM | POA: Diagnosis not present

## 2016-09-22 DIAGNOSIS — R1114 Bilious vomiting: Secondary | ICD-10-CM | POA: Diagnosis not present

## 2016-09-22 DIAGNOSIS — Z87891 Personal history of nicotine dependence: Secondary | ICD-10-CM | POA: Diagnosis not present

## 2016-09-22 DIAGNOSIS — K210 Gastro-esophageal reflux disease with esophagitis: Secondary | ICD-10-CM | POA: Diagnosis not present

## 2016-09-22 DIAGNOSIS — R112 Nausea with vomiting, unspecified: Secondary | ICD-10-CM | POA: Diagnosis not present

## 2016-09-22 DIAGNOSIS — R109 Unspecified abdominal pain: Secondary | ICD-10-CM | POA: Diagnosis not present

## 2016-09-22 DIAGNOSIS — Z9884 Bariatric surgery status: Secondary | ICD-10-CM | POA: Diagnosis not present

## 2016-09-22 DIAGNOSIS — Z6841 Body Mass Index (BMI) 40.0 and over, adult: Secondary | ICD-10-CM | POA: Diagnosis not present

## 2016-09-22 DIAGNOSIS — Z79899 Other long term (current) drug therapy: Secondary | ICD-10-CM | POA: Diagnosis not present

## 2016-09-22 DIAGNOSIS — E785 Hyperlipidemia, unspecified: Secondary | ICD-10-CM | POA: Diagnosis not present

## 2016-09-22 DIAGNOSIS — R740 Nonspecific elevation of levels of transaminase and lactic acid dehydrogenase [LDH]: Secondary | ICD-10-CM | POA: Diagnosis not present

## 2016-09-23 DIAGNOSIS — R740 Nonspecific elevation of levels of transaminase and lactic acid dehydrogenase [LDH]: Secondary | ICD-10-CM

## 2016-09-23 DIAGNOSIS — Z9884 Bariatric surgery status: Secondary | ICD-10-CM | POA: Diagnosis not present

## 2016-09-23 DIAGNOSIS — R7401 Elevation of levels of liver transaminase levels: Secondary | ICD-10-CM | POA: Insufficient documentation

## 2016-09-23 DIAGNOSIS — R748 Abnormal levels of other serum enzymes: Secondary | ICD-10-CM | POA: Diagnosis not present

## 2016-09-23 DIAGNOSIS — Z6841 Body Mass Index (BMI) 40.0 and over, adult: Secondary | ICD-10-CM | POA: Diagnosis not present

## 2016-09-23 DIAGNOSIS — R1013 Epigastric pain: Secondary | ICD-10-CM | POA: Diagnosis not present

## 2016-09-23 DIAGNOSIS — R110 Nausea: Secondary | ICD-10-CM | POA: Diagnosis not present

## 2016-09-23 DIAGNOSIS — E785 Hyperlipidemia, unspecified: Secondary | ICD-10-CM | POA: Diagnosis not present

## 2016-09-23 DIAGNOSIS — Z87891 Personal history of nicotine dependence: Secondary | ICD-10-CM | POA: Diagnosis not present

## 2016-09-23 DIAGNOSIS — K210 Gastro-esophageal reflux disease with esophagitis: Secondary | ICD-10-CM | POA: Diagnosis not present

## 2016-09-23 DIAGNOSIS — Z79899 Other long term (current) drug therapy: Secondary | ICD-10-CM | POA: Diagnosis not present

## 2016-09-23 DIAGNOSIS — R945 Abnormal results of liver function studies: Secondary | ICD-10-CM | POA: Diagnosis not present

## 2016-09-23 DIAGNOSIS — R112 Nausea with vomiting, unspecified: Secondary | ICD-10-CM | POA: Diagnosis not present

## 2016-09-24 DIAGNOSIS — R1013 Epigastric pain: Secondary | ICD-10-CM | POA: Diagnosis not present

## 2016-09-24 DIAGNOSIS — K210 Gastro-esophageal reflux disease with esophagitis: Secondary | ICD-10-CM | POA: Diagnosis not present

## 2016-09-24 DIAGNOSIS — R740 Nonspecific elevation of levels of transaminase and lactic acid dehydrogenase [LDH]: Secondary | ICD-10-CM | POA: Diagnosis not present

## 2016-09-24 DIAGNOSIS — Z9884 Bariatric surgery status: Secondary | ICD-10-CM | POA: Diagnosis not present

## 2016-09-24 DIAGNOSIS — E785 Hyperlipidemia, unspecified: Secondary | ICD-10-CM | POA: Diagnosis not present

## 2016-09-24 DIAGNOSIS — Z79899 Other long term (current) drug therapy: Secondary | ICD-10-CM | POA: Diagnosis not present

## 2016-09-24 DIAGNOSIS — R748 Abnormal levels of other serum enzymes: Secondary | ICD-10-CM | POA: Diagnosis not present

## 2016-09-24 DIAGNOSIS — R112 Nausea with vomiting, unspecified: Secondary | ICD-10-CM | POA: Diagnosis not present

## 2016-09-24 DIAGNOSIS — Z87891 Personal history of nicotine dependence: Secondary | ICD-10-CM | POA: Diagnosis not present

## 2016-09-24 DIAGNOSIS — Z6841 Body Mass Index (BMI) 40.0 and over, adult: Secondary | ICD-10-CM | POA: Diagnosis not present

## 2016-09-29 ENCOUNTER — Inpatient Hospital Stay: Payer: Self-pay | Admitting: Internal Medicine

## 2016-09-29 ENCOUNTER — Encounter: Payer: Self-pay | Admitting: Internal Medicine

## 2016-09-29 ENCOUNTER — Ambulatory Visit (INDEPENDENT_AMBULATORY_CARE_PROVIDER_SITE_OTHER): Payer: BLUE CROSS/BLUE SHIELD | Admitting: Internal Medicine

## 2016-09-29 VITALS — BP 102/70 | HR 65 | Ht 66.0 in | Wt 151.0 lb

## 2016-09-29 DIAGNOSIS — R945 Abnormal results of liver function studies: Principal | ICD-10-CM

## 2016-09-29 DIAGNOSIS — D649 Anemia, unspecified: Secondary | ICD-10-CM | POA: Diagnosis not present

## 2016-09-29 DIAGNOSIS — R7989 Other specified abnormal findings of blood chemistry: Secondary | ICD-10-CM

## 2016-09-29 DIAGNOSIS — Z9884 Bariatric surgery status: Secondary | ICD-10-CM

## 2016-09-29 NOTE — Patient Instructions (Addendum)
Hospital Course: Amber Adkins is a 41 yo female s/p sleeve in 04/2015 and lap cholecystectomy in 02/2016 presented to clinic on 09/22/2016 with nausea, vomiting, abdominal pain, and elevated LFTs. Taken for endoscopy and EUS without evidence of abnormality. Admitted that same day for continued work up. Additional labs for PT, Creatine Kinase, acetaminophen, salicylate, and Ethanol were all within normal limits. On hospital day 1 her pain and nausea improved and she began eating a normal diet. Her hepatitis labs returned non-reactive and her abdominal CT showed no abnormalities. Hepatology was consulted and stated they believed the pain and nausea was caused by a retained stone in the common bile duct. LFTs and T bili were down trending at the time of discharge. Autoimmune hepatitis labs were still pending, though hepatology felt this to be a less likely etiology of her transaminitis.   She improved and was discharged on 4/19 with hepatology follow up. She will also follow up for lab check and PCP follow up on 4/24 prior to her hepatology appointment.      Cholelithiasis Cholelithiasis is a form of gallbladder disease in which gallstones form in the gallbladder. The gallbladder is an organ that stores bile. Bile is made in the liver, and it helps to digest fats. Gallstones begin as small crystals and slowly grow into stones. They may cause no symptoms until the gallbladder tightens (contracts) and a gallstone is blocking the duct (gallbladder attack), which can cause pain. Cholelithiasis is also referred to as gallstones. There are two main types of gallstones:  Cholesterol stones. These are made of hardened cholesterol and are usually yellow-green in color. They are the most common type of gallstone. Cholesterol is a white, waxy, fat-like substance that is made in the liver.  Pigment stones. These are dark in color and are made of a red-yellow substance that forms when hemoglobin from red blood cells  breaks down (bilirubin). What are the causes? This condition may be caused by an imbalance in the substances that bile is made of. This can happen if the bile:  Has too much bilirubin.  Has too much cholesterol.  Does not have enough bile salts. These salts help the body absorb and digest fats. In some cases, this condition can also be caused by the gallbladder not emptying completely or often enough. What increases the risk? The following factors may make you more likely to develop this condition:  Being female.  Having multiple pregnancies. Health care providers sometimes advise removing diseased gallbladders before future pregnancies.  Eating a diet that is heavy in fried foods, fat, and refined carbohydrates, like white bread and white rice.  Being obese.  Being older than age 42.  Prolonged use of medicines that contain female hormones (estrogen).  Having diabetes mellitus.  Rapidly losing weight.  Having a family history of gallstones.  Being of [place] or [place] descent.  Having an intestinal disease such as Crohn disease.  Having metabolic syndrome.  Having cirrhosis.  Having severe types of anemia such as sickle cell anemia. What are the signs or symptoms? In most cases, there are no symptoms. These are known as silent gallstones. If a gallstone blocks the bile ducts, it can cause a gallbladder attack. The main symptom of a gallbladder attack is sudden pain in the upper right abdomen. The pain usually comes at night or after eating a large meal. The pain can last for one or several hours and can spread to the right shoulder or chest. If the bile duct  is blocked for more than a few hours, it can cause infection or inflammation of the gallbladder, liver, or pancreas, which may cause:  Nausea.  Vomiting.  Abdominal pain that lasts for 5 hours or more.  Fever or chills.  Yellowing of the skin or the whites of the eyes (jaundice).  Dark  urine.  Light-colored stools. How is this diagnosed? This condition may be diagnosed based on:  A physical exam.  Your medical history.  An ultrasound of your gallbladder.  CT scan.  MRI.  Blood tests to check for signs of infection or inflammation.  A scan of your gallbladder and bile ducts (biliary system) using nonharmful radioactive material and special cameras that can see the radioactive material (cholescintigram). This test checks to see how your gallbladder contracts and whether bile ducts are blocked.  Inserting a small tube with a camera on the end (endoscope) through your mouth to inspect bile ducts and check for blockages (endoscopic retrograde cholangiopancreatogram). How is this treated? Treatment for gallstones depends on the severity of the condition. Silent gallstones do not need treatment. If the gallstones cause a gallbladder attack or other symptoms, treatment may be required. Options for treatment include:  Surgery to remove the gallbladder (cholecystectomy). This is the most common treatment.  Medicines to dissolve gallstones. These are most effective at treating small gallstones. You may need to take medicines for up to 6-12 months.  Shock wave treatment (extracorporeal biliary lithotripsy). In this treatment, an ultrasound machine sends shock waves to the gallbladder to break gallstones into smaller pieces. These pieces can then be passed into the intestines or be dissolved by medicine. This is rarely used.  Removing gallstones through endoscopic retrograde cholangiopancreatogram. A small basket can be attached to the endoscope and used to capture and remove gallstones. Follow these instructions at home:  Take over-the-counter and prescription medicines only as told by your health care provider.  Maintain a healthy weight and follow a healthy diet. This includes:  Reducing fatty foods, such as fried food.  Reducing refined carbohydrates, like white bread  and white rice.  Increasing fiber. Aim for foods like almonds, fruit, and beans.  Keep all follow-up visits as told by your health care provider. This is important. Contact a health care provider if:  You think you have had a gallbladder attack.  You have been diagnosed with silent gallstones and you develop abdominal pain or indigestion. Get help right away if:  You have pain from a gallbladder attack that lasts for more than 2 hours.  You have abdominal pain that lasts for more than 5 hours.  You have a fever or chills.  You have persistent nausea and vomiting.  You develop jaundice.  You have dark urine or light-colored stools. Summary  Cholelithiasis (also called gallstones) is a form of gallbladder disease in which gallstones form in the gallbladder.  This condition is caused by an imbalance in the substances that make up bile. This can happen if the bile has too much cholesterol, too much bilirubin, or not enough bile salts.  You are more likely to develop this condition if you are female, pregnant, using medicines with estrogen, obese, older than age 19, or have a family history of gallstones. You may also develop gallstones if you have diabetes, an intestinal disease, cirrhosis, or metabolic syndrome.  Treatment for gallstones depends on the severity of the condition. Silent gallstones do not need treatment.  If gallstones cause a gallbladder attack or other symptoms, treatment may be  needed. The most common treatment is surgery to remove the gallbladder. This information is not intended to replace advice given to you by your health care provider. Make sure you discuss any questions you have with your health care provider. Document Released: 05/21/2005 Document Revised: 02/09/2016 Document Reviewed: 02/09/2016 Elsevier Interactive Patient Education  2017 [employer].

## 2016-09-29 NOTE — Progress Notes (Signed)
Date:  09/29/2016   Name:  Amber Adkins   DOB:  [birth year]   MRN:  [id number]   Chief Complaint: Hospitalization Follow-up (Still feeling chronic fatique. Legs feel achy and wore out. No energy.) Hospitalized at [facility] 4/17-4/19/18 for elevated liver function tests, nausea, vomiting and weakness.  She had a normal CT and normal EGD.  Extensive blood tests were done for autoimmune, Wilson's, etc were all negative.  She did have a mild anemia - hgb 10.5. She was discharged improved with the impression that the cause was a retained CBD stone that passed spontaneously.  D/C note says that she was to be set up for Hepatology follow up but that was not scheduled. She has no more pain but feels tired.  She has been getting sufficient fluids but it sounds like insufficient protein.   Review of Systems  Constitutional: Positive for fatigue. Negative for chills and fever.  Respiratory: Negative for chest tightness and shortness of breath.   Cardiovascular: Negative for chest pain.  Gastrointestinal: Negative for abdominal pain, constipation, nausea and vomiting.  Skin: Negative for color change.  Neurological: Positive for weakness. Negative for dizziness, seizures, numbness and headaches.    Patient Active Problem List   Diagnosis Date Noted  . Transaminitis 09/23/2016  . Gastro-esophageal reflux disease without esophagitis 07/23/2015  . Status post bariatric surgery 04/24/2015  . HLD (hyperlipidemia) 03/20/2015  . 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 Tobacco use disorder, mild, in sustained remission 09/27/2014    Prior to Admission medications   Medication Sig Start Date End Date Taking? Authorizing Provider  estradiol (ESTRACE) 1 MG tablet TAKE 1 TABLET BY MOUTH EVERY DAY 10/16/15  Yes Reubin Milan, MD  pantoprazole (PROTONIX) 40 MG tablet Take 40 mg by mouth daily. 09/25/16 10/25/16 Yes Historical Provider, MD    Allergies  Allergen Reactions  . Tape Hives   Surgical tape and surgical glue    Past Surgical History:  Procedure Laterality Date  . ABDOMINAL HYSTERECTOMY  2010   total  . CHOLECYSTECTOMY, LAPAROSCOPIC  03/04/2016  . LAPAROSCOPIC GASTRIC SLEEVE RESECTION  04/2015    Social History  Substance Use Topics  . Smoking status: Former Smoker    Packs/day: 2.00    Years: 15.00    Types: Cigarettes    Quit date: 06/08/2013  . Smokeless tobacco: Never Used  . Alcohol use 0.0 oz/week     Comment: social     Medication list has been reviewed and updated.   Physical Exam  Constitutional: She is oriented to person, place, and time. She appears well-developed. No distress.  HENT:  Head: Normocephalic and atraumatic.  Neck: Normal range of motion.  Cardiovascular: Normal rate, regular rhythm and normal heart sounds.   Pulmonary/Chest: Effort normal and breath sounds normal. No respiratory distress. She has no wheezes.  Abdominal: Soft. Bowel sounds are normal. She exhibits no distension and no mass. There is no tenderness. There is no rebound and no guarding.  Musculoskeletal: Normal range of motion.  Neurological: She is alert and oriented to person, place, and time.  Skin: Skin is warm and dry. No rash noted.  Psychiatric: She has a normal mood and affect. Her behavior is normal. Thought content normal.  Nursing note and vitals reviewed.   BP 102/70   Pulse 65   Ht  (1.676 m)   Wt 151 lb (68.5 kg)   SpO2 97%   BMI 24.37 kg/m   Assessment and Plan: 1. Elevated liver  function tests Will repeat to demonstrate downward trend - if sx remain resolved may not need specialist referral - Hepatic function panel  2. Status post bariatric surgery Encouraged increase intact of protein  3. Anemia, unspecified type Recheck and evaluate further if needed - CBC with Differential/Platelet   No orders of the defined types were placed in this encounter.   Bari Edward, MD [facility]  09/29/2016

## 2016-09-30 LAB — CBC WITH DIFFERENTIAL/PLATELET
BASOS ABS: 0 10*3/uL (ref 0.0–0.2)
BASOS: 0 %
EOS (ABSOLUTE): 0.1 10*3/uL (ref 0.0–0.4)
EOS: 1 %
Hematocrit: 35.8 % (ref 34.0–46.6)
Hemoglobin: 11.7 g/dL (ref 11.1–15.9)
IMMATURE GRANS (ABS): 0 10*3/uL (ref 0.0–0.1)
IMMATURE GRANULOCYTES: 0 %
LYMPHS: 33 %
Lymphocytes Absolute: 2.4 10*3/uL (ref 0.7–3.1)
MCH: 29.5 pg (ref 26.6–33.0)
MCHC: 32.7 g/dL (ref 31.5–35.7)
MCV: 90 fL (ref 79–97)
MONOS ABS: 0.4 10*3/uL (ref 0.1–0.9)
Monocytes: 6 %
NEUTROS PCT: 60 %
Neutrophils Absolute: 4.2 10*3/uL (ref 1.4–7.0)
PLATELETS: 320 10*3/uL (ref 150–379)
RBC: 3.97 x10E6/uL (ref 3.77–5.28)
RDW: 12.9 % (ref 12.3–15.4)
WBC: 7.1 10*3/uL (ref 3.4–10.8)

## 2016-09-30 LAB — HEPATIC FUNCTION PANEL
ALBUMIN: 4.4 g/dL (ref 3.5–5.5)
ALT: 195 IU/L — ABNORMAL HIGH (ref 0–32)
AST: 20 IU/L (ref 0–40)
Alkaline Phosphatase: 162 IU/L — ABNORMAL HIGH (ref 39–117)
Bilirubin Total: 1.8 mg/dL — ABNORMAL HIGH (ref 0.0–1.2)
Bilirubin, Direct: 0.35 mg/dL (ref 0.00–0.40)
Total Protein: 6.6 g/dL (ref 6.0–8.5)

## 2016-10-14 ENCOUNTER — Other Ambulatory Visit: Payer: BLUE CROSS/BLUE SHIELD

## 2016-10-14 ENCOUNTER — Other Ambulatory Visit: Payer: Self-pay | Admitting: Internal Medicine

## 2016-10-14 DIAGNOSIS — R740 Nonspecific elevation of levels of transaminase and lactic acid dehydrogenase [LDH]: Secondary | ICD-10-CM | POA: Diagnosis not present

## 2016-10-14 DIAGNOSIS — R7401 Elevation of levels of liver transaminase levels: Secondary | ICD-10-CM

## 2016-10-15 LAB — HEPATIC FUNCTION PANEL
ALBUMIN: 4.4 g/dL (ref 3.5–5.5)
ALK PHOS: 82 IU/L (ref 39–117)
ALT: 13 IU/L (ref 0–32)
AST: 17 IU/L (ref 0–40)
BILIRUBIN, DIRECT: 0.33 mg/dL (ref 0.00–0.40)
Bilirubin Total: 1.6 mg/dL — ABNORMAL HIGH (ref 0.0–1.2)
TOTAL PROTEIN: 6.6 g/dL (ref 6.0–8.5)

## 2016-10-16 ENCOUNTER — Telehealth: Payer: Self-pay

## 2016-10-16 NOTE — Telephone Encounter (Signed)
Pt called stating Saturday started to vomit and have diarrhea, having bad RLQ shooting pain. By Sunday Afternoon starting feeling better. Now feels normal. Is this normal pain for bile stones? Pt requesting advice.

## 2016-10-16 NOTE — Telephone Encounter (Signed)
She won't have stones if she does not have a gall bladder.

## 2016-10-24 ENCOUNTER — Other Ambulatory Visit: Payer: Self-pay | Admitting: Internal Medicine

## 2016-11-20 DIAGNOSIS — Z903 Acquired absence of stomach [part of]: Secondary | ICD-10-CM | POA: Diagnosis not present

## 2016-11-20 DIAGNOSIS — Z6824 Body mass index (BMI) 24.0-24.9, adult: Secondary | ICD-10-CM | POA: Diagnosis not present

## 2016-11-20 DIAGNOSIS — K219 Gastro-esophageal reflux disease without esophagitis: Secondary | ICD-10-CM | POA: Diagnosis not present

## 2016-11-20 DIAGNOSIS — Z713 Dietary counseling and surveillance: Secondary | ICD-10-CM | POA: Diagnosis not present

## 2016-11-28 ENCOUNTER — Other Ambulatory Visit: Payer: Self-pay | Admitting: Internal Medicine

## 2017-01-27 ENCOUNTER — Other Ambulatory Visit: Payer: Self-pay | Admitting: Internal Medicine

## 2017-03-26 ENCOUNTER — Other Ambulatory Visit: Payer: Self-pay

## 2017-03-26 MED ORDER — PANTOPRAZOLE SODIUM 40 MG PO TBEC: DELAYED_RELEASE_TABLET | ORAL | 1 refills | Status: DC

## 2017-06-22 DIAGNOSIS — H524 Presbyopia: Secondary | ICD-10-CM | POA: Diagnosis not present

## 2017-09-02 ENCOUNTER — Other Ambulatory Visit: Payer: Self-pay | Admitting: Internal Medicine

## 2017-10-04 ENCOUNTER — Other Ambulatory Visit: Payer: Self-pay | Admitting: Internal Medicine

## 2017-10-26 ENCOUNTER — Ambulatory Visit (INDEPENDENT_AMBULATORY_CARE_PROVIDER_SITE_OTHER): Payer: BLUE CROSS/BLUE SHIELD | Admitting: Internal Medicine

## 2017-10-26 ENCOUNTER — Encounter: Payer: Self-pay | Admitting: Internal Medicine

## 2017-10-26 VITALS — BP 103/78 | HR 74 | Resp 16 | Ht 66.0 in | Wt 176.0 lb

## 2017-10-26 DIAGNOSIS — Z1231 Encounter for screening mammogram for malignant neoplasm of breast: Secondary | ICD-10-CM

## 2017-10-26 DIAGNOSIS — F17201 Nicotine dependence, unspecified, in remission: Secondary | ICD-10-CM

## 2017-10-26 DIAGNOSIS — Z8349 Family history of other endocrine, nutritional and metabolic diseases: Secondary | ICD-10-CM | POA: Diagnosis not present

## 2017-10-26 DIAGNOSIS — Z1239 Encounter for other screening for malignant neoplasm of breast: Secondary | ICD-10-CM

## 2017-10-26 DIAGNOSIS — K219 Gastro-esophageal reflux disease without esophagitis: Secondary | ICD-10-CM | POA: Diagnosis not present

## 2017-10-26 DIAGNOSIS — Z0000 Encounter for general adult medical examination without abnormal findings: Secondary | ICD-10-CM | POA: Diagnosis not present

## 2017-10-26 DIAGNOSIS — E782 Mixed hyperlipidemia: Secondary | ICD-10-CM | POA: Diagnosis not present

## 2017-10-26 LAB — POCT URINALYSIS DIPSTICK
BILIRUBIN UA: NEGATIVE
Blood, UA: NEGATIVE
GLUCOSE UA: NEGATIVE
LEUKOCYTES UA: NEGATIVE
Nitrite, UA: NEGATIVE
Protein, UA: NEGATIVE
Spec Grav, UA: 1.01 (ref 1.010–1.025)
Urobilinogen, UA: 0.2 E.U./dL
pH, UA: 6.5 (ref 5.0–8.0)

## 2017-10-26 MED ORDER — ESTRADIOL 1 MG PO TABS: 1.0000 mg | ORAL_TABLET | Freq: Every day | ORAL | 3 refills | Status: DC

## 2017-10-26 NOTE — Progress Notes (Signed)
Date:  10/26/2017   Name:  Amber Adkins   DOB:  12-17-1975   MRN:  [id number]   Chief Complaint: Annual Exam (no pap-Needs Estrace refills for next few months if staying on it. )  Amber Adkins is a 42 y.o. female who presents today for her Complete Annual Exam. She feels well. She reports exercising regularly. She reports she is sleeping fairly well. She denies breast issues. She wants to be tested for hemachromoatosis since her mother is a carrier.  Her sister tested negative.  Gastroesophageal Reflux  She reports no abdominal pain, no chest pain, no coughing or no wheezing. Pertinent negatives include no fatigue.   Menopausal sx - currently on HRT.  She is doing well with minimal hot flashes and irritability.  Sleep is good.   Review of Systems  Constitutional: Negative for chills, fatigue and fever.  HENT: Negative for congestion, hearing loss, tinnitus, trouble swallowing and voice change.   Eyes: Negative for visual disturbance.  Respiratory: Negative for cough, chest tightness, shortness of breath and wheezing.   Cardiovascular: Negative for chest pain, palpitations and leg swelling.  Gastrointestinal: Negative for abdominal pain, constipation, diarrhea and vomiting.  Endocrine: Negative for polydipsia and polyuria.  Genitourinary: Negative for dysuria, frequency, genital sores, vaginal bleeding and vaginal discharge.  Musculoskeletal: Negative for arthralgias, gait problem and joint swelling.  Skin: Negative for color change and rash.  Neurological: Negative for dizziness, tremors, light-headedness and headaches.  Hematological: Negative for adenopathy. Does not bruise/bleed easily.  Psychiatric/Behavioral: Negative for dysphoric mood and sleep disturbance. The patient is not nervous/anxious.     Patient Active Problem List   Diagnosis Date Noted  . Transaminitis 09/23/2016  . Gastro-esophageal reflux disease without esophagitis 07/23/2015  . Status  post bariatric surgery 04/24/2015  . HLD (hyperlipidemia) 03/20/2015  . 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 Tobacco use disorder, mild, in sustained remission 09/27/2014    Prior to Admission medications   Medication Sig Start Date End Date Taking? Authorizing Provider  estradiol (ESTRACE) 1 MG tablet TAKE 1 TABLET BY MOUTH EVERY DAY 09/02/17  Yes Reubin Milan, MD  pantoprazole (PROTONIX) 40 MG tablet TAKE 1 TABLET BY MOUTH EVERY DAY AT 6 AM 03/26/17  Yes Reubin Milan, MD    Allergies  Allergen Reactions  . Tape Hives    Surgical tape and surgical glue    Past Surgical History:  Procedure Laterality Date  . ABDOMINAL HYSTERECTOMY  2010   total  . CHOLECYSTECTOMY, LAPAROSCOPIC  03/04/2016  . LAPAROSCOPIC GASTRIC SLEEVE RESECTION  04/2015    Social History   Tobacco Use  . Smoking status: Former Smoker    Packs/day: 2.00    Years: 15.00    Pack years: 30.00    Types: Cigarettes    Last attempt to quit: 06/08/2013    Years since quitting: 4.3  . Smokeless tobacco: Never Used  Substance Use Topics  . Alcohol use: Yes    Alcohol/week: 0.0 oz    Comment: social  . Drug use: No     Medication list has been reviewed and updated.  PHQ 2/9 Scores 10/26/2017  PHQ - 2 Score 0    Physical Exam  Constitutional: She is oriented to person, place, and time. She appears well-developed and well-nourished. No distress.  HENT:  Head: Normocephalic and atraumatic.  Right Ear: Tympanic membrane and ear canal normal.  Left Ear: Tympanic membrane and ear canal normal.  Nose: Right sinus exhibits no maxillary  sinus tenderness. Left sinus exhibits no maxillary sinus tenderness.  Mouth/Throat: Uvula is midline and oropharynx is clear and moist.  Eyes: Conjunctivae and EOM are normal. Right eye exhibits no discharge. Left eye exhibits no discharge. No scleral icterus.  Neck: Normal range of motion. Carotid bruit is not present. No erythema present. No thyromegaly present.   Cardiovascular: Normal rate, regular rhythm, normal heart sounds and normal pulses.  Pulmonary/Chest: Effort normal. No respiratory distress. She has no wheezes. Right breast exhibits no mass, no nipple discharge, no skin change and no tenderness. Left breast exhibits no mass, no nipple discharge, no skin change and no tenderness.  Abdominal: Soft. Bowel sounds are normal. There is no hepatosplenomegaly. There is no tenderness. There is no CVA tenderness.  Musculoskeletal: Normal range of motion.  Lymphadenopathy:    She has no cervical adenopathy.    She has no axillary adenopathy.  Neurological: She is alert and oriented to person, place, and time. She has normal reflexes. No cranial nerve deficit or sensory deficit.  Skin: Skin is warm, dry and intact. No rash noted.  Psychiatric: She has a normal mood and affect. Her speech is normal and behavior is normal. Thought content normal.  Nursing note and vitals reviewed.   BP 103/78   Pulse 74   Resp 16   Ht  (1.676 m)   Wt 176 lb (79.8 kg)   SpO2 99%   BMI 28.41 kg/m   Assessment and Plan: 1. Annual physical exam Normal exam Resume diet and exercise - TSH - POCT urinalysis dipstick  2. Breast cancer screening - MM DIGITAL SCREENING BILATERAL; Future  3. Gastro-esophageal reflux disease without esophagitis Controlled with PPI - CBC with Differential/Platelet  4. Mixed hyperlipidemia - Comprehensive metabolic panel - Lipid panel  5. Tobacco use disorder, mild, in sustained remission Still remains tobacco free  6. FHx: hemochromatosis Check ferritin first, then genetic testing if abnormal - Ferritin   Meds ordered this encounter  Medications  . estradiol (ESTRACE) 1 MG tablet    Sig: Take 1 tablet (1 mg total) by mouth daily.    Dispense:  90 tablet    Refill:  3    Partially dictated using Animal nutritionist. Any errors are unintentional.  Bari Edward, MD [facility]  10/26/2017

## 2017-10-26 NOTE — Patient Instructions (Signed)

## 2017-10-27 LAB — TSH: TSH: 1.05 u[IU]/mL (ref 0.450–4.500)

## 2017-10-27 LAB — CBC WITH DIFFERENTIAL/PLATELET
BASOS ABS: 0 10*3/uL (ref 0.0–0.2)
Basos: 1 %
EOS (ABSOLUTE): 0.1 10*3/uL (ref 0.0–0.4)
Eos: 2 %
Hematocrit: 32.1 % — ABNORMAL LOW (ref 34.0–46.6)
Hemoglobin: 10.2 g/dL — ABNORMAL LOW (ref 11.1–15.9)
Immature Grans (Abs): 0 10*3/uL (ref 0.0–0.1)
Immature Granulocytes: 0 %
LYMPHS ABS: 1.4 10*3/uL (ref 0.7–3.1)
Lymphs: 31 %
MCH: 26.4 pg — AB (ref 26.6–33.0)
MCHC: 31.8 g/dL (ref 31.5–35.7)
MCV: 83 fL (ref 79–97)
Monocytes Absolute: 0.3 10*3/uL (ref 0.1–0.9)
Monocytes: 6 %
NEUTROS ABS: 2.6 10*3/uL (ref 1.4–7.0)
Neutrophils: 60 %
PLATELETS: 329 10*3/uL (ref 150–450)
RBC: 3.87 x10E6/uL (ref 3.77–5.28)
RDW: 14.2 % (ref 12.3–15.4)
WBC: 4.4 10*3/uL (ref 3.4–10.8)

## 2017-10-27 LAB — LIPID PANEL
Chol/HDL Ratio: 3.2 ratio (ref 0.0–4.4)
Cholesterol, Total: 229 mg/dL — ABNORMAL HIGH (ref 100–199)
HDL: 72 mg/dL (ref >39)
LDL Calculated: 135 mg/dL — ABNORMAL HIGH (ref 0–99)
Triglycerides: 109 mg/dL (ref 0–149)
VLDL CHOLESTEROL CAL: 22 mg/dL (ref 5–40)

## 2017-10-27 LAB — COMPREHENSIVE METABOLIC PANEL
ALBUMIN: 4.2 g/dL (ref 3.5–5.5)
ALT: 12 IU/L (ref 0–32)
AST: 12 IU/L (ref 0–40)
Albumin/Globulin Ratio: 2.1 (ref 1.2–2.2)
Alkaline Phosphatase: 63 IU/L (ref 39–117)
BILIRUBIN TOTAL: 1.2 mg/dL (ref 0.0–1.2)
BUN / CREAT RATIO: 19 (ref 9–23)
BUN: 13 mg/dL (ref 6–24)
CHLORIDE: 104 mmol/L (ref 96–106)
CO2: 24 mmol/L (ref 20–29)
Calcium: 9.4 mg/dL (ref 8.7–10.2)
Creatinine, Ser: 0.69 mg/dL (ref 0.57–1.00)
GFR calc Af Amer: 125 mL/min/{1.73_m2} (ref >59)
GFR calc non Af Amer: 108 mL/min/{1.73_m2} (ref >59)
GLUCOSE: 94 mg/dL (ref 65–99)
Globulin, Total: 2 g/dL (ref 1.5–4.5)
Potassium: 4.2 mmol/L (ref 3.5–5.2)
Sodium: 142 mmol/L (ref 134–144)
Total Protein: 6.2 g/dL (ref 6.0–8.5)

## 2017-10-27 LAB — FERRITIN: FERRITIN: 8 ng/mL — AB (ref 15–150)

## 2017-11-02 ENCOUNTER — Encounter: Payer: Self-pay | Admitting: Internal Medicine

## 2017-11-03 ENCOUNTER — Other Ambulatory Visit: Payer: Self-pay | Admitting: Internal Medicine

## 2017-11-03 ENCOUNTER — Ambulatory Visit
Admission: RE | Admit: 2017-11-03 | Discharge: 2017-11-03 | Disposition: A | Discharge status: Home / Self Care | Payer: BLUE CROSS/BLUE SHIELD | Source: Ambulatory Visit | Attending: Internal Medicine | Admitting: Internal Medicine

## 2017-11-03 DIAGNOSIS — Z1231 Encounter for screening mammogram for malignant neoplasm of breast: Secondary | ICD-10-CM | POA: Insufficient documentation

## 2017-11-03 DIAGNOSIS — Z1239 Encounter for other screening for malignant neoplasm of breast: Secondary | ICD-10-CM

## 2017-11-04 ENCOUNTER — Other Ambulatory Visit: Payer: Self-pay | Admitting: Internal Medicine

## 2017-11-04 DIAGNOSIS — N6310 Unspecified lump in the right breast, unspecified quadrant: Secondary | ICD-10-CM

## 2017-11-04 DIAGNOSIS — R928 Other abnormal and inconclusive findings on diagnostic imaging of breast: Secondary | ICD-10-CM

## 2017-11-08 ENCOUNTER — Encounter

## 2017-11-11 ENCOUNTER — Ambulatory Visit
Admission: RE | Admit: 2017-11-11 | Discharge: 2017-11-11 | Disposition: A | Discharge status: Home / Self Care | Payer: BLUE CROSS/BLUE SHIELD | Source: Ambulatory Visit | Attending: Internal Medicine | Admitting: Internal Medicine

## 2017-11-11 DIAGNOSIS — R928 Other abnormal and inconclusive findings on diagnostic imaging of breast: Secondary | ICD-10-CM

## 2017-11-11 DIAGNOSIS — N6310 Unspecified lump in the right breast, unspecified quadrant: Secondary | ICD-10-CM | POA: Diagnosis not present

## 2018-04-11 ENCOUNTER — Telehealth: Payer: Self-pay

## 2018-04-11 ENCOUNTER — Other Ambulatory Visit: Payer: Self-pay | Admitting: Internal Medicine

## 2018-04-11 DIAGNOSIS — N6310 Unspecified lump in the right breast, unspecified quadrant: Secondary | ICD-10-CM

## 2018-04-11 HISTORY — DX: Unspecified lump in the right breast, unspecified quadrant: N63.10

## 2018-04-11 NOTE — Telephone Encounter (Signed)
The orders are placed and someone from [place] or the scheduling center will be calling.

## 2018-04-11 NOTE — Telephone Encounter (Signed)
Patient called and left VM stating she was told by [facility] that a f/up mammogram and [place] is needed and they have "not received orders from Dr Judithann Graves" so they can not schedule this until we order these.  Please Advise.

## 2018-04-11 NOTE — Telephone Encounter (Signed)
Patient informed. 

## 2018-04-18 ENCOUNTER — Other Ambulatory Visit: Payer: Self-pay | Admitting: Internal Medicine

## 2018-04-18 DIAGNOSIS — R928 Other abnormal and inconclusive findings on diagnostic imaging of breast: Secondary | ICD-10-CM

## 2018-04-26 ENCOUNTER — Other Ambulatory Visit: Payer: Self-pay

## 2018-04-26 MED ORDER — PANTOPRAZOLE SODIUM 40 MG PO TBEC: DELAYED_RELEASE_TABLET | ORAL | 1 refills | Status: DC

## 2018-05-12 ENCOUNTER — Encounter: Payer: Self-pay | Admitting: Internal Medicine

## 2018-05-12 ENCOUNTER — Ambulatory Visit (INDEPENDENT_AMBULATORY_CARE_PROVIDER_SITE_OTHER): Payer: BLUE CROSS/BLUE SHIELD | Admitting: Internal Medicine

## 2018-05-12 VITALS — BP 100/62 | HR 70 | Ht 66.0 in | Wt 174.0 lb

## 2018-05-12 DIAGNOSIS — K219 Gastro-esophageal reflux disease without esophagitis: Secondary | ICD-10-CM | POA: Diagnosis not present

## 2018-05-12 DIAGNOSIS — L720 Epidermal cyst: Secondary | ICD-10-CM | POA: Diagnosis not present

## 2018-05-12 DIAGNOSIS — N6310 Unspecified lump in the right breast, unspecified quadrant: Secondary | ICD-10-CM | POA: Diagnosis not present

## 2018-05-12 MED ORDER — PANTOPRAZOLE SODIUM 40 MG PO TBEC: DELAYED_RELEASE_TABLET | ORAL | 3 refills | Status: DC

## 2018-05-12 NOTE — Progress Notes (Addendum)
Date:  05/12/2018   Name:  Amber Adkins   DOB:  06-Sep-1975   MRN:  [id number]   Chief Complaint: Gastroesophageal Reflux (Refilled Protonix. ) and Cyst (Knot on the back of right ear. Had looked at in 2012 at dermatology they told her it was calcium deposit. Wanted referral to have removed. Husband sqeezed the area and white pus came out. )  Gastroesophageal Reflux  She complains of abdominal pain (intermittent reflux). She reports no chest pain. Pertinent negatives include no fatigue.  Cyst of earlobe - cyst behind her earlobe on the right has been present for 14 years.  She has noticed it getting bigger and her husband squeezing out some thick white foul material.  Review of Systems  Constitutional: Negative for chills, fatigue and fever.  Respiratory: Negative for chest tightness and shortness of breath.   Cardiovascular: Negative for chest pain and palpitations.  Gastrointestinal: Positive for abdominal pain (intermittent reflux).  Skin:       Cyst behind right ear    Patient Active Problem List   Diagnosis Date Noted  . Breast mass, right 04/11/2018  . Transaminitis 09/23/2016  . Gastro-esophageal reflux disease without esophagitis 07/23/2015  . Status post bariatric surgery 04/24/2015  . HLD (hyperlipidemia) 03/20/2015  . 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 Tobacco use disorder, mild, in sustained remission 09/27/2014    Allergies  Allergen Reactions  . Tape Hives    Surgical tape and surgical glue    Past Surgical History:  Procedure Laterality Date  . ABDOMINAL HYSTERECTOMY  2010   total  . CHOLECYSTECTOMY, LAPAROSCOPIC  03/04/2016  . LAPAROSCOPIC GASTRIC SLEEVE RESECTION  04/2015    Social History   Tobacco Use  . Smoking status: Former Smoker    Packs/day: 2.00    Years: 15.00    Pack years: 30.00    Types: Cigarettes    Last attempt to quit: 06/08/2013    Years since quitting: 4.9  . Smokeless tobacco: Never Used  Substance Use  Topics  . Alcohol use: Yes    Alcohol/week: 0.0 standard drinks    Comment: social  . Drug use: No     Medication list has been reviewed and updated.  Current Meds  Medication Sig  . estradiol (ESTRACE) 1 MG tablet Take 1 tablet (1 mg total) by mouth daily.  . pantoprazole (PROTONIX) 40 MG tablet TAKE 1 TABLET BY MOUTH EVERY DAY AT 6 AM  . [DISCONTINUED] pantoprazole (PROTONIX) 40 MG tablet TAKE 1 TABLET BY MOUTH EVERY DAY AT 6 AM    PHQ 2/9 Scores 10/26/2017  PHQ - 2 Score 0    Physical Exam  Constitutional: She is oriented to person, place, and time. She appears well-developed. No distress.  HENT:  Head: Normocephalic and atraumatic.  Neck: Normal range of motion. Neck supple.  Cardiovascular: Normal rate, regular rhythm and normal heart sounds.  Pulmonary/Chest: Effort normal and breath sounds normal. No respiratory distress.  Musculoskeletal: Normal range of motion.  Lymphadenopathy:    She has no cervical adenopathy.  Neurological: She is alert and oriented to person, place, and time.  Skin: Skin is warm and dry. No rash noted.  0.75 cm soft nontender smooth cyst behind right ear - fluctuance noted  Psychiatric: She has a normal mood and affect. Her behavior is normal. Thought content normal.  Nursing note and vitals reviewed.   BP 100/62 (BP Location: Right Arm, Patient Position: Sitting, Cuff Size: Normal)   Pulse 70  Ht 5\' 6"  (1.676 m)   Wt 174 lb (78.9 kg)   SpO2 100%   BMI 28.08 kg/m   Assessment and Plan: 1. Epidermoid cyst of skin of ear No evidence of infection - leave the area alone Refer to Derm - Ambulatory referral to Dermatology  2. Gastro-esophageal reflux disease without esophagitis Stable on PPI  3. Breast mass, right Follow up scheduled on Monday   Partially dictated using Dragon software. Any errors are unintentional.  Bari EdwardLaura Rilee Wendling, MD [facility]  05/12/2018

## 2018-05-16 ENCOUNTER — Ambulatory Visit
Admission: RE | Admit: 2018-05-16 | Discharge: 2018-05-16 | Disposition: A | Discharge status: Home / Self Care | Payer: BLUE CROSS/BLUE SHIELD | Source: Ambulatory Visit | Attending: Internal Medicine | Admitting: Internal Medicine

## 2018-05-16 DIAGNOSIS — N6310 Unspecified lump in the right breast, unspecified quadrant: Secondary | ICD-10-CM | POA: Insufficient documentation

## 2018-05-16 DIAGNOSIS — R928 Other abnormal and inconclusive findings on diagnostic imaging of breast: Secondary | ICD-10-CM | POA: Diagnosis not present

## 2018-06-14 DIAGNOSIS — H6001 Abscess of right external ear: Secondary | ICD-10-CM | POA: Diagnosis not present

## 2018-10-27 ENCOUNTER — Ambulatory Visit (INDEPENDENT_AMBULATORY_CARE_PROVIDER_SITE_OTHER): Payer: BLUE CROSS/BLUE SHIELD | Admitting: Internal Medicine

## 2018-10-27 ENCOUNTER — Other Ambulatory Visit: Payer: Self-pay

## 2018-10-27 ENCOUNTER — Encounter: Payer: Self-pay | Admitting: Internal Medicine

## 2018-10-27 VITALS — BP 108/76 | HR 65 | Resp 16 | Ht 66.0 in | Wt 176.0 lb

## 2018-10-27 DIAGNOSIS — K219 Gastro-esophageal reflux disease without esophagitis: Secondary | ICD-10-CM | POA: Diagnosis not present

## 2018-10-27 DIAGNOSIS — Z0000 Encounter for general adult medical examination without abnormal findings: Secondary | ICD-10-CM | POA: Diagnosis not present

## 2018-10-27 DIAGNOSIS — E782 Mixed hyperlipidemia: Secondary | ICD-10-CM

## 2018-10-27 DIAGNOSIS — R928 Other abnormal and inconclusive findings on diagnostic imaging of breast: Secondary | ICD-10-CM | POA: Diagnosis not present

## 2018-10-27 DIAGNOSIS — F17201 Nicotine dependence, unspecified, in remission: Secondary | ICD-10-CM

## 2018-10-27 DIAGNOSIS — E8940 Asymptomatic postprocedural ovarian failure: Secondary | ICD-10-CM | POA: Diagnosis not present

## 2018-10-27 LAB — POCT URINALYSIS DIPSTICK
Bilirubin, UA: NEGATIVE
Blood, UA: NEGATIVE
Glucose, UA: NEGATIVE
Ketones, UA: NEGATIVE
Leukocytes, UA: NEGATIVE
Nitrite, UA: NEGATIVE
Protein, UA: NEGATIVE
Spec Grav, UA: 1.01 (ref 1.010–1.025)
Urobilinogen, UA: 0.2 E.U./dL
pH, UA: 6 (ref 5.0–8.0)

## 2018-10-27 MED ORDER — ESTRADIOL 1 MG PO TABS: 1.0000 mg | ORAL_TABLET | Freq: Every day | ORAL | 3 refills | Status: DC

## 2018-10-27 NOTE — Progress Notes (Signed)
Date:  10/27/2018   Name:  Amber Adkins   DOB:  [birth year]   MRN:  [id number]   Chief Complaint: Annual Exam Amber Adkins is a 43 y.o. female who presents today for her Complete Annual Exam. She feels well. She reports exercising regularly. She reports she is sleeping fairly well. She is working from home and doing well.  Worried about her mother who has cirrhosis and continues to drink heavily every day.  Mammogram 10/2017 - 6 mo follow up 05/2018 rec 6 mo Bilat Dx with right US Pap no longer needed  Gastroesophageal Reflux  She complains of heartburn. She reports no abdominal pain, no chest pain, no coughing or no wheezing. The problem occurs occasionally. Pertinent negatives include no fatigue. She has tried a PPI for the symptoms.    Review of Systems  Constitutional: Negative for chills, fatigue and fever.  HENT: Negative for congestion, hearing loss, tinnitus, trouble swallowing and voice change.   Eyes: Negative for visual disturbance.  Respiratory: Negative for cough, chest tightness, shortness of breath and wheezing.   Cardiovascular: Negative for chest pain, palpitations and leg swelling.  Gastrointestinal: Positive for heartburn. Negative for abdominal pain, constipation, diarrhea and vomiting.  Endocrine: Negative for polydipsia and polyuria.  Genitourinary: Negative for dysuria, frequency, genital sores, vaginal bleeding and vaginal discharge.  Musculoskeletal: Negative for arthralgias, gait problem and joint swelling.  Skin: Negative for color change and rash.  Neurological: Negative for dizziness, tremors, light-headedness and headaches.  Hematological: Negative for adenopathy. Does not bruise/bleed easily.  Psychiatric/Behavioral: Negative for dysphoric mood and sleep disturbance. The patient is not nervous/anxious.     Patient Active Problem List   Diagnosis Date Noted  . Epidermoid cyst of skin of ear 05/12/2018  . Breast mass, right  04/11/2018  . Transaminitis 09/23/2016  . Gastro-esophageal reflux disease without esophagitis 07/23/2015  . Status post bariatric surgery 04/24/2015  . Mixed hyperlipidemia 03/20/2015  . 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 Tobacco use disorder, mild, in sustained remission 09/27/2014    Allergies  Allergen Reactions  . Tape Hives    Surgical tape and surgical glue    Past Surgical History:  Procedure Laterality Date  . ABDOMINAL HYSTERECTOMY  2010   total  . CHOLECYSTECTOMY, LAPAROSCOPIC  03/04/2016  . LAPAROSCOPIC GASTRIC SLEEVE RESECTION  04/2015    Social History   Tobacco Use  . Smoking status: Former Smoker    Packs/day: 2.00    Years: 15.00    Pack years: 30.00    Types: Cigarettes    Last attempt to quit: 06/08/2013    Years since quitting: 5.3  . Smokeless tobacco: Never Used  Substance Use Topics  . Alcohol use: Yes    Alcohol/week: 0.0 standard drinks    Comment: social  . Drug use: No     Medication list has been reviewed and updated.  Current Meds  Medication Sig  . estradiol (ESTRACE) 1 MG tablet Take 1 tablet (1 mg total) by mouth daily.  . pantoprazole (PROTONIX) 40 MG tablet TAKE 1 TABLET BY MOUTH EVERY DAY AT 6 AM  . [DISCONTINUED] estradiol (ESTRACE) 1 MG tablet Take 1 tablet (1 mg total) by mouth daily.    PHQ 2/9 Scores 10/26/2017  PHQ - 2 Score 0    BP Readings from Last 3 Encounters:  10/27/18 108/76  05/12/18 100/62  10/26/17 103/78    Physical Exam Vitals signs and nursing note reviewed.  Constitutional:  General: She is not in acute distress.    Appearance: She is well-developed.  HENT:     Head: Normocephalic and atraumatic.     Right Ear: Tympanic membrane and ear canal normal.     Left Ear: Tympanic membrane and ear canal normal.     Nose:     Right Sinus: No maxillary sinus tenderness.     Left Sinus: No maxillary sinus tenderness.     Mouth/Throat:     Pharynx: Uvula midline.  Eyes:     General: No  scleral icterus.       Right eye: No discharge.        Left eye: No discharge.     Conjunctiva/sclera: Conjunctivae normal.  Neck:     Musculoskeletal: Normal range of motion. No erythema.     Thyroid: No thyromegaly.     Vascular: No carotid bruit.  Cardiovascular:     Rate and Rhythm: Normal rate and regular rhythm.     Pulses: Normal pulses.     Heart sounds: Normal heart sounds.  Pulmonary:     Effort: Pulmonary effort is normal. No respiratory distress.     Breath sounds: No wheezing.  Chest:     Breasts:        Right: No mass, nipple discharge, skin change or tenderness.        Left: No mass, nipple discharge, skin change or tenderness.  Abdominal:     General: Bowel sounds are normal.     Palpations: Abdomen is soft.     Tenderness: There is no abdominal tenderness.  Musculoskeletal: Normal range of motion.  Lymphadenopathy:     Cervical: No cervical adenopathy.  Skin:    General: Skin is warm and dry.     Findings: No rash.  Neurological:     Mental Status: She is alert and oriented to person, place, and time.     Cranial Nerves: No cranial nerve deficit.     Sensory: No sensory deficit.     Deep Tendon Reflexes: Reflexes are normal and symmetric.  Psychiatric:        Speech: Speech normal.        Behavior: Behavior normal.        Thought Content: Thought content normal.     Wt Readings from Last 3 Encounters:  10/27/18 176 lb (79.8 kg)  05/12/18 174 lb (78.9 kg)  10/26/17 176 lb (79.8 kg)    BP 108/76   Pulse 65   Resp 16   Ht 5\' 6"  (1.676 m)   Wt 176 lb (79.8 kg)   SpO2 97%   BMI 28.41 kg/m   Assessment and Plan: 1. Annual physical exam Normal exam Continue healthy diet and exercise - TSH - POCT urinalysis dipstick  2. Abnormality of right breast on screening mammogram - MM DIAG BREAST TOMO BILATERAL; Future - US BREAST LTD UNI RIGHT INC AXILLA; Future  3. Gastro-esophageal reflux disease without esophagitis Continue PPI - CBC with  Differential/Platelet  4. Tobacco use disorder, mild, in sustained remission Remains free of tobacco  5. Mixed hyperlipidemia Check labs - Comprehensive metabolic panel - Lipid panel  6. Failed, ovarian, postablative Continue HRT  Partially dictated using Animal nutritionist. Any errors are unintentional.  Bari Edward, MD [facility]  10/27/2018

## 2018-10-27 NOTE — Patient Instructions (Signed)

## 2018-10-28 LAB — CBC WITH DIFFERENTIAL/PLATELET
Basophils Absolute: 0 10*3/uL (ref 0.0–0.2)
Basos: 1 %
EOS (ABSOLUTE): 0.1 10*3/uL (ref 0.0–0.4)
Eos: 1 %
Hematocrit: 30.7 % — ABNORMAL LOW (ref 34.0–46.6)
Hemoglobin: 10.3 g/dL — ABNORMAL LOW (ref 11.1–15.9)
Immature Grans (Abs): 0 10*3/uL (ref 0.0–0.1)
Immature Granulocytes: 0 %
Lymphocytes Absolute: 1.6 10*3/uL (ref 0.7–3.1)
Lymphs: 27 %
MCH: 27 pg (ref 26.6–33.0)
MCHC: 33.6 g/dL (ref 31.5–35.7)
MCV: 80 fL (ref 79–97)
Monocytes Absolute: 0.4 10*3/uL (ref 0.1–0.9)
Monocytes: 6 %
Neutrophils Absolute: 3.9 10*3/uL (ref 1.4–7.0)
Neutrophils: 65 %
Platelets: 340 10*3/uL (ref 150–450)
RBC: 3.82 x10E6/uL (ref 3.77–5.28)
RDW: 13.2 % (ref 11.7–15.4)
WBC: 6 10*3/uL (ref 3.4–10.8)

## 2018-10-28 LAB — COMPREHENSIVE METABOLIC PANEL
ALT: 24 IU/L (ref 0–32)
AST: 20 IU/L (ref 0–40)
Albumin/Globulin Ratio: 2.4 — ABNORMAL HIGH (ref 1.2–2.2)
Albumin: 4.6 g/dL (ref 3.8–4.8)
Alkaline Phosphatase: 81 IU/L (ref 39–117)
BUN/Creatinine Ratio: 23 (ref 9–23)
BUN: 15 mg/dL (ref 6–24)
Bilirubin Total: 0.7 mg/dL (ref 0.0–1.2)
CO2: 24 mmol/L (ref 20–29)
Calcium: 9.6 mg/dL (ref 8.7–10.2)
Chloride: 104 mmol/L (ref 96–106)
Creatinine, Ser: 0.65 mg/dL (ref 0.57–1.00)
GFR calc Af Amer: 127 mL/min/{1.73_m2} (ref >59)
GFR calc non Af Amer: 110 mL/min/{1.73_m2} (ref >59)
Globulin, Total: 1.9 g/dL (ref 1.5–4.5)
Glucose: 86 mg/dL (ref 65–99)
Potassium: 4.2 mmol/L (ref 3.5–5.2)
Sodium: 145 mmol/L — ABNORMAL HIGH (ref 134–144)
Total Protein: 6.5 g/dL (ref 6.0–8.5)

## 2018-10-28 LAB — LIPID PANEL
Chol/HDL Ratio: 3.3 ratio (ref 0.0–4.4)
Cholesterol, Total: 218 mg/dL — ABNORMAL HIGH (ref 100–199)
HDL: 66 mg/dL (ref >39)
LDL Calculated: 133 mg/dL — ABNORMAL HIGH (ref 0–99)
Triglycerides: 93 mg/dL (ref 0–149)
VLDL Cholesterol Cal: 19 mg/dL (ref 5–40)

## 2018-10-28 LAB — TSH: TSH: 1.03 u[IU]/mL (ref 0.450–4.500)

## 2018-11-10 ENCOUNTER — Other Ambulatory Visit: Payer: Self-pay

## 2018-11-10 ENCOUNTER — Ambulatory Visit
Admission: RE | Admit: 2018-11-10 | Discharge: 2018-11-10 | Disposition: A | Discharge status: Home / Self Care | Payer: BLUE CROSS/BLUE SHIELD | Source: Ambulatory Visit | Attending: Internal Medicine | Admitting: Internal Medicine

## 2018-11-10 DIAGNOSIS — R928 Other abnormal and inconclusive findings on diagnostic imaging of breast: Secondary | ICD-10-CM | POA: Diagnosis not present

## 2019-06-05 ENCOUNTER — Other Ambulatory Visit: Payer: Self-pay | Admitting: Internal Medicine

## 2019-07-19 ENCOUNTER — Other Ambulatory Visit: Payer: Self-pay

## 2019-07-19 DIAGNOSIS — E8940 Asymptomatic postprocedural ovarian failure: Secondary | ICD-10-CM

## 2019-07-19 MED ORDER — PANTOPRAZOLE SODIUM 40 MG PO TBEC: DELAYED_RELEASE_TABLET | ORAL | 0 refills | Status: DC

## 2019-07-19 MED ORDER — ESTRADIOL 1 MG PO TABS: 1.0000 mg | ORAL_TABLET | Freq: Every day | ORAL | 0 refills | Status: DC

## 2019-08-26 ENCOUNTER — Other Ambulatory Visit: Payer: Self-pay | Admitting: Internal Medicine

## 2019-08-26 DIAGNOSIS — E8940 Asymptomatic postprocedural ovarian failure: Secondary | ICD-10-CM

## 2019-09-09 ENCOUNTER — Other Ambulatory Visit: Payer: Self-pay | Admitting: Internal Medicine

## 2019-10-30 ENCOUNTER — Encounter: Payer: BLUE CROSS/BLUE SHIELD | Admitting: Internal Medicine

## 2019-12-28 ENCOUNTER — Encounter: Payer: Self-pay | Admitting: Internal Medicine

## 2020-11-25 ENCOUNTER — Other Ambulatory Visit: Payer: Self-pay

## 2020-11-25 ENCOUNTER — Encounter: Payer: Self-pay | Admitting: Internal Medicine

## 2020-11-25 ENCOUNTER — Ambulatory Visit (INDEPENDENT_AMBULATORY_CARE_PROVIDER_SITE_OTHER): Payer: Managed Care, Other (non HMO) | Admitting: Internal Medicine

## 2020-11-25 VITALS — BP 106/70 | HR 85 | Temp 97.5°F | Ht 66.0 in | Wt 180.0 lb

## 2020-11-25 DIAGNOSIS — E782 Mixed hyperlipidemia: Secondary | ICD-10-CM

## 2020-11-25 DIAGNOSIS — R928 Other abnormal and inconclusive findings on diagnostic imaging of breast: Secondary | ICD-10-CM | POA: Diagnosis not present

## 2020-11-25 DIAGNOSIS — K219 Gastro-esophageal reflux disease without esophagitis: Secondary | ICD-10-CM

## 2020-11-25 DIAGNOSIS — Z0000 Encounter for general adult medical examination without abnormal findings: Secondary | ICD-10-CM

## 2020-11-25 DIAGNOSIS — Z1159 Encounter for screening for other viral diseases: Secondary | ICD-10-CM

## 2020-11-25 MED ORDER — PANTOPRAZOLE SODIUM 40 MG PO TBEC: DELAYED_RELEASE_TABLET | ORAL | 3 refills | Status: DC

## 2020-11-25 NOTE — Progress Notes (Signed)
Date:  11/25/2020   Name:  Amber Adkins   DOB:  1975/06/11   MRN:  [id number]   Chief Complaint: Annual Exam (Breast exam no pap) Amber Adkins is a 45 y.o. female who presents today for her Complete Annual Exam. She feels well. She reports exercising walking X7 days a week 3 miles a day, works out 3 days a week. She reports she is sleeping well. Breast complaints none.  Mammogram: 11/2018 - suspicious area right breast req Dx mammo and [place] DEXA: none Pap smear: discontinued Colonoscopy: none  Immunization History  Administered Date(s) Administered   Tdap 06/08/2012    Gastroesophageal Reflux She complains of heartburn. She reports no abdominal pain, no chest pain, no coughing, no dysphagia, no tooth decay or no wheezing. This is a recurrent problem. The problem occurs occasionally. The symptoms are aggravated by lying down and certain foods. Pertinent negatives include no fatigue, melena, orthopnea or weight loss. She has tried a PPI for the symptoms. The treatment provided significant relief.   Lab Results  Component Value Date   CREATININE 0.65 10/27/2018   BUN 15 10/27/2018   NA 145 (H) 10/27/2018   K 4.2 10/27/2018   CL 104 10/27/2018   CO2 24 10/27/2018   Lab Results  Component Value Date   CHOL 218 (H) 10/27/2018   HDL 66 10/27/2018   LDLCALC 133 (H) 10/27/2018   TRIG 93 10/27/2018   CHOLHDL 3.3 10/27/2018   Lab Results  Component Value Date   TSH 1.030 10/27/2018   Lab Results  Component Value Date   HGBA1C 5.8 (H) 12/13/2014   Lab Results  Component Value Date   WBC 6.0 10/27/2018   HGB 10.3 (L) 10/27/2018   HCT 30.7 (L) 10/27/2018   MCV 80 10/27/2018   PLT 340 10/27/2018   Lab Results  Component Value Date   ALT 24 10/27/2018   AST 20 10/27/2018   ALKPHOS 81 10/27/2018   BILITOT 0.7 10/27/2018     Review of Systems  Constitutional:  Negative for chills, fatigue, fever and weight loss.  HENT:  Negative for congestion,  hearing loss, tinnitus, trouble swallowing and voice change.   Eyes:  Negative for visual disturbance.  Respiratory:  Negative for cough, chest tightness, shortness of breath and wheezing.   Cardiovascular:  Negative for chest pain, palpitations and leg swelling.  Gastrointestinal:  Positive for heartburn. Negative for abdominal pain, constipation, diarrhea, dysphagia, melena and vomiting.  Endocrine: Negative for polydipsia and polyuria.  Genitourinary:  Negative for dysuria, frequency, genital sores, vaginal bleeding and vaginal discharge.  Musculoskeletal:  Negative for arthralgias, gait problem and joint swelling.  Skin:  Negative for color change and rash.  Neurological:  Negative for dizziness, tremors, light-headedness and headaches.  Hematological:  Negative for adenopathy. Does not bruise/bleed easily.  Psychiatric/Behavioral:  Negative for dysphoric mood and sleep disturbance. The patient is not nervous/anxious.    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/27/2014   Tobacco use disorder, mild, in sustained remission 09/27/2014    Allergies  Allergen Reactions   Tape Hives    Surgical tape and surgical glue    Past Surgical History:  Procedure Laterality Date   ABDOMINAL HYSTERECTOMY  2010   total   CHOLECYSTECTOMY, LAPAROSCOPIC  03/04/2016   LAPAROSCOPIC GASTRIC SLEEVE RESECTION  04/2015    Social History   Tobacco Use   Smoking status: Former    Packs/day: 2.00    Years: 15.00    Pack years: 30.00    Types: Cigarettes    Quit date: 06/08/2013    Years since quitting: 7.4   Smokeless tobacco: Never  Vaping Use   Vaping Use: Never used  Substance Use Topics   Alcohol use:  Yes    Alcohol/week: 0.0 standard drinks    Comment: social   Drug use: No     Medication list has been reviewed and updated.  Current Meds  Medication Sig   estradiol (ESTRACE) 1 MG tablet TAKE 1 TABLET BY MOUTH  DAILY   [DISCONTINUED] pantoprazole (PROTONIX) 40 MG tablet TAKE 1 TABLET BY MOUTH  DAILY AT 6 AM    PHQ 2/9 Scores 11/25/2020 10/26/2017  PHQ - 2 Score 0 0  PHQ- 9 Score 0 -    GAD 7 : Generalized Anxiety Score 11/25/2020  Nervous, Anxious, on Edge 0  Control/stop worrying 0  Worry too much - different things 0  Trouble relaxing 0  Restless 0  Easily annoyed or irritable 0  Afraid - awful might happen 0  Total GAD 7 Score 0  Anxiety Difficulty Not difficult at all    BP Readings from Last 3 Encounters:  11/25/20 106/70  10/27/18 108/76  05/12/18 100/62    Physical Exam Vitals and nursing note reviewed.  Constitutional:      General: She is not in acute distress.    Appearance: She is well-developed.  HENT:     Head: Normocephalic and atraumatic.     Right Ear: Tympanic membrane and ear canal normal.     Left Ear: Tympanic membrane and ear canal normal.     Nose:     Right Sinus: No maxillary sinus tenderness.     Left Sinus: No maxillary sinus tenderness.  Eyes:     General: No scleral icterus.       Right eye: No discharge.        Left eye: No discharge.     Conjunctiva/sclera: Conjunctivae normal.  Neck:     Thyroid: No thyromegaly.     Vascular: No carotid bruit.  Cardiovascular:     Rate and Rhythm: Normal rate and regular rhythm.     Pulses: Normal pulses.     Heart sounds: Normal heart sounds.  Pulmonary:     Effort: Pulmonary effort is normal. No respiratory distress.     Breath sounds: No wheezing.  Chest:  Breasts:    Right: No mass, nipple discharge, skin change or tenderness.     Left: No mass, nipple discharge, skin change or tenderness.  Abdominal:     General: Bowel sounds are normal.     Palpations: Abdomen is soft.      Tenderness: There is no abdominal tenderness.  Musculoskeletal:     Cervical back: Normal range of motion. No erythema.     Right lower leg: No edema.     Left lower leg: No edema.  Lymphadenopathy:     Cervical: No cervical adenopathy.  Skin:    General: Skin is warm and dry.     Findings: No rash.  Neurological:     Mental Status: She is alert and oriented to person, place, and time.     Cranial Nerves: No cranial nerve deficit.  Sensory: No sensory deficit.     Deep Tendon Reflexes: Reflexes are normal and symmetric.  Psychiatric:        Attention and Perception: Attention normal.        Mood and Affect: Mood normal.    Wt Readings from Last 3 Encounters:  11/25/20 180 lb (81.6 kg)  10/27/18 176 lb (79.8 kg)  05/12/18 174 lb (78.9 kg)    BP 106/70   Pulse 85   Temp (!) 97.5 F (36.4 C) (Oral)   Ht 5\' 6"  (1.676 m)   Wt 180 lb (81.6 kg)   SpO2 99%   BMI 29.05 kg/m   Assessment and Plan: 1. Annual physical exam Normal exam Continue exercise, work on diet changes - Comprehensive metabolic panel  2. Gastro-esophageal reflux disease without esophagitis Symptoms well controlled on daily PPI No red flag signs such as weight loss, n/v, melena Will continue pantoprazole - CBC with Differential/Platelet - pantoprazole (PROTONIX) 40 MG tablet; TAKE 1 TABLET BY MOUTH  DAILY AT 6 AM  Dispense: 90 tablet; Refill: 3  3. Mixed hyperlipidemia Check labs and advise - Lipid panel  4. Need for hepatitis C screening test - Hepatitis C antibody  5. Abnormal mammogram of right breast Seen on mammogram in 2020 - pt did not follow up - MM DIAG BREAST TOMO BILATERAL; Future - 2021 BREAST LTD UNI RIGHT INC AXILLA; Future   Partially dictated using [place]. Any errors are unintentional.  Animal nutritionist, MD [facility]  11/25/2020

## 2020-11-26 LAB — COMPREHENSIVE METABOLIC PANEL
ALT: 16 IU/L (ref 0–32)
AST: 16 IU/L (ref 0–40)
Albumin/Globulin Ratio: 2.2 (ref 1.2–2.2)
Albumin: 4.6 g/dL (ref 3.8–4.8)
Alkaline Phosphatase: 78 IU/L (ref 44–121)
BUN/Creatinine Ratio: 13 (ref 9–23)
BUN: 10 mg/dL (ref 6–24)
Bilirubin Total: 1.6 mg/dL — ABNORMAL HIGH (ref 0.0–1.2)
CO2: 25 mmol/L (ref 20–29)
Calcium: 9.8 mg/dL (ref 8.7–10.2)
Chloride: 104 mmol/L (ref 96–106)
Creatinine, Ser: 0.8 mg/dL (ref 0.57–1.00)
Globulin, Total: 2.1 g/dL (ref 1.5–4.5)
Glucose: 95 mg/dL (ref 65–99)
Potassium: 4.2 mmol/L (ref 3.5–5.2)
Sodium: 141 mmol/L (ref 134–144)
Total Protein: 6.7 g/dL (ref 6.0–8.5)
eGFR: 93 mL/min/{1.73_m2} (ref >59)

## 2020-11-26 LAB — LIPID PANEL
Chol/HDL Ratio: 3.2 ratio (ref 0.0–4.4)
Cholesterol, Total: 225 mg/dL — ABNORMAL HIGH (ref 100–199)
HDL: 71 mg/dL (ref >39)
LDL Chol Calc (NIH): 140 mg/dL — ABNORMAL HIGH (ref 0–99)
Triglycerides: 78 mg/dL (ref 0–149)
VLDL Cholesterol Cal: 14 mg/dL (ref 5–40)

## 2020-11-26 LAB — CBC WITH DIFFERENTIAL/PLATELET
Basophils Absolute: 0 10*3/uL (ref 0.0–0.2)
Basos: 1 %
EOS (ABSOLUTE): 0.1 10*3/uL (ref 0.0–0.4)
Eos: 1 %
Hematocrit: 35.2 % (ref 34.0–46.6)
Hemoglobin: 11.7 g/dL (ref 11.1–15.9)
Immature Grans (Abs): 0 10*3/uL (ref 0.0–0.1)
Immature Granulocytes: 0 %
Lymphocytes Absolute: 1.6 10*3/uL (ref 0.7–3.1)
Lymphs: 35 %
MCH: 28.3 pg (ref 26.6–33.0)
MCHC: 33.2 g/dL (ref 31.5–35.7)
MCV: 85 fL (ref 79–97)
Monocytes Absolute: 0.3 10*3/uL (ref 0.1–0.9)
Monocytes: 7 %
Neutrophils Absolute: 2.5 10*3/uL (ref 1.4–7.0)
Neutrophils: 56 %
Platelets: 311 10*3/uL (ref 150–450)
RBC: 4.13 x10E6/uL (ref 3.77–5.28)
RDW: 12.9 % (ref 11.7–15.4)
WBC: 4.5 10*3/uL (ref 3.4–10.8)

## 2020-11-26 LAB — HEPATITIS C ANTIBODY: Hep C Virus Ab: <0.1 s/co ratio (ref 0.0–0.9)

## 2020-12-02 ENCOUNTER — Ambulatory Visit
Admission: RE | Admit: 2020-12-02 | Discharge: 2020-12-02 | Disposition: A | Discharge status: Home / Self Care | Payer: Managed Care, Other (non HMO) | Source: Ambulatory Visit | Attending: Internal Medicine | Admitting: Internal Medicine

## 2020-12-02 ENCOUNTER — Other Ambulatory Visit: Payer: Self-pay | Admitting: Internal Medicine

## 2020-12-02 ENCOUNTER — Other Ambulatory Visit: Payer: Self-pay

## 2020-12-02 DIAGNOSIS — N6320 Unspecified lump in the left breast, unspecified quadrant: Secondary | ICD-10-CM | POA: Diagnosis present

## 2020-12-02 DIAGNOSIS — R928 Other abnormal and inconclusive findings on diagnostic imaging of breast: Secondary | ICD-10-CM

## 2021-01-06 ENCOUNTER — Encounter: Payer: Self-pay | Admitting: Internal Medicine

## 2021-01-06 ENCOUNTER — Ambulatory Visit: Payer: Self-pay

## 2021-01-06 ENCOUNTER — Telehealth: Payer: Self-pay

## 2021-01-06 ENCOUNTER — Other Ambulatory Visit: Payer: Self-pay

## 2021-01-06 ENCOUNTER — Ambulatory Visit (INDEPENDENT_AMBULATORY_CARE_PROVIDER_SITE_OTHER): Payer: Managed Care, Other (non HMO) | Admitting: Internal Medicine

## 2021-01-06 VITALS — Ht 66.0 in

## 2021-01-06 DIAGNOSIS — U071 COVID-19: Secondary | ICD-10-CM | POA: Diagnosis not present

## 2021-01-06 DIAGNOSIS — H6690 Otitis media, unspecified, unspecified ear: Secondary | ICD-10-CM

## 2021-01-06 MED ORDER — AZITHROMYCIN 250 MG PO TABS: ORAL_TABLET | ORAL | 0 refills | Status: AC

## 2021-01-06 NOTE — Telephone Encounter (Signed)
Noted  KP 

## 2021-01-06 NOTE — Progress Notes (Signed)
Date:  01/06/2021   Name:  Amber Adkins   DOB:  12-31-1975   MRN:  [id number]  This encounter was conducted via video encounter due to the need for social distancing in light of the Covid-19 pandemic.  The patient was correctly identified.  I advised that I am conducting the visit from a secure room in my office at [facility] clinic.  The patient is located at home. The limitations of this form of encounter were discussed with the patient and he/she agreed to proceed.  Some vital signs will be absent.  Chief Complaint: Covid Positive (Today  Home test positive, feels horrible, ear pain, threw up vitamins, left ear clogged, head congestion )  Otalgia  Associated symptoms include headaches and vomiting. Pertinent negatives include no coughing, ear discharge or sore throat.  Covid positive - tested today after her husband was exposed at work and felt sick all weekend.  She now has head congestion and headache.  She is not able to take her temperature.   Lab Results  Component Value Date   CREATININE 0.80 11/25/2020   BUN 10 11/25/2020   NA 141 11/25/2020   K 4.2 11/25/2020   CL 104 11/25/2020   CO2 25 11/25/2020   Lab Results  Component Value Date   CHOL 225 (H) 11/25/2020   HDL 71 11/25/2020   LDLCALC 140 (H) 11/25/2020   TRIG 78 11/25/2020   CHOLHDL 3.2 11/25/2020   Lab Results  Component Value Date   TSH 1.030 10/27/2018   Lab Results  Component Value Date   HGBA1C 5.8 (H) 12/13/2014   Lab Results  Component Value Date   WBC 4.5 11/25/2020   HGB 11.7 11/25/2020   HCT 35.2 11/25/2020   MCV 85 11/25/2020   PLT 311 11/25/2020   Lab Results  Component Value Date   ALT 16 11/25/2020   AST 16 11/25/2020   ALKPHOS 78 11/25/2020   BILITOT 1.6 (H) 11/25/2020     Review of Systems  Constitutional:  Positive for chills and fatigue. Negative for fever.  HENT:  Positive for ear pain. Negative for ear discharge, sore throat and trouble swallowing.    Respiratory:  Negative for cough, chest tightness, shortness of breath and wheezing.   Cardiovascular:  Negative for leg swelling.  Gastrointestinal:  Positive for vomiting.  Neurological:  Positive for dizziness and headaches.   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/27/2014   Tobacco use disorder, mild, in sustained remission 09/27/2014    Allergies  Allergen Reactions   Tape Hives    Surgical tape and surgical glue    Past Surgical History:  Procedure Laterality Date   ABDOMINAL HYSTERECTOMY  2010   total   CHOLECYSTECTOMY, LAPAROSCOPIC  03/04/2016   LAPAROSCOPIC GASTRIC SLEEVE RESECTION  04/2015    Social History   Tobacco Use   Smoking status: Former    Packs/day: 2.00    Years: 15.00    Pack years: 30.00    Types: Cigarettes    Quit date: 06/08/2013    Years since quitting: 7.5   Smokeless tobacco: Never  Vaping Use   Vaping Use: Never used  Substance Use Topics   Alcohol use: Yes  Alcohol/week: 0.0 standard drinks    Comment: social   Drug use: No     Medication list has been reviewed and updated.  Current Meds  Medication Sig   estradiol (ESTRACE) 1 MG tablet TAKE 1 TABLET BY MOUTH  DAILY   pantoprazole (PROTONIX) 40 MG tablet TAKE 1 TABLET BY MOUTH  DAILY AT 6 AM    PHQ 2/9 Scores 01/06/2021 11/25/2020 10/26/2017  PHQ - 2 Score 0 0 0  PHQ- 9 Score 0 0 -    GAD 7 : Generalized Anxiety Score 01/06/2021 11/25/2020  Nervous, Anxious, on Edge 0 0  Control/stop worrying 0 0  Worry too much - different things 0 0  Trouble relaxing 0 0  Restless 0 0  Easily annoyed or irritable 0 0  Afraid - awful might happen 0 0  Total GAD 7 Score 0 0  Anxiety Difficulty -  Not difficult at all    BP Readings from Last 3 Encounters:  11/25/20 106/70  10/27/18 108/76  05/12/18 100/62    Physical Exam HENT:     Left Ear: Decreased hearing noted. No drainage.     Ears:     Comments: No pain on movement of left external ear    Nose:     Right Sinus: No maxillary sinus tenderness or frontal sinus tenderness.     Left Sinus: No maxillary sinus tenderness or frontal sinus tenderness.  Pulmonary:     Effort: Pulmonary effort is normal.     Comments: No cough or dyspnea noted during the call Neurological:     Mental Status: She is alert.  Psychiatric:        Attention and Perception: Attention normal.        Mood and Affect: Mood normal.        Speech: Speech normal.        Cognition and Memory: Cognition normal.    Wt Readings from Last 3 Encounters:  11/25/20 180 lb (81.6 kg)  10/27/18 176 lb (79.8 kg)  05/12/18 174 lb (78.9 kg)    Ht 5\' 6"  (1.676 m)   BMI 29.05 kg/m   Assessment and Plan: 1. COVID Continue tylenol OTC for headache Continue Mucinex, fluids and rest Quarantine recommendations provided.  2. Subacute otitis media, unspecified otitis media type Continue mucinex Will treat presumptively - azithromycin (ZITHROMAX Z-PAK) 250 MG tablet; UAD  Dispense: 6 each; Refill: 0  I spent 12 minutes on this encounter. Partially dictated using . Any errors are unintentional.  Animal nutritionist, MD [facility]  01/06/2021

## 2021-01-06 NOTE — Telephone Encounter (Signed)

## 2021-01-06 NOTE — Telephone Encounter (Signed)
Reason for Disposition  MILD difficulty breathing (e.g., minimal/no SOB at rest, SOB with walking, pulse <100)  Answer Assessment - Initial Assessment Questions 1. COVID-19 DIAGNOSIS: "Who made your COVID-19 diagnosis?" "Was it confirmed by a positive lab test or self-test?" If not diagnosed by a doctor (or NP/PA), ask "Are there lots of cases (community spread) where you live?" Note: See public health department website, if unsure.     Home test 2. COVID-19 EXPOSURE: "Was there any known exposure to COVID before the symptoms began?" CDC Definition of close contact: within 6 feet (2 meters) for a total of 15 minutes or more over a 24-hour period.      Husband 3. ONSET: "When did the COVID-19 symptoms start?"      Weekend 4. WORST SYMPTOM: "What is your worst symptom?" (e.g., cough, fever, shortness of breath, muscle aches)     Headache, vomiting 5. COUGH: "Do you have a cough?" If Yes, ask: "How bad is the cough?"       No 6. FEVER: "Do you have a fever?" If Yes, ask: "What is your temperature, how was it measured, and when did it start?"     Unsure 7. RESPIRATORY STATUS: "Describe your breathing?" (e.g., shortness of breath, wheezing, unable to speak)      No 8. BETTER-SAME-WORSE: "Are you getting better, staying the same or getting worse compared to yesterday?"  If getting worse, ask, "In what way?"     Worse 9. HIGH RISK DISEASE: "Do you have any chronic medical problems?" (e.g., asthma, heart or lung disease, weak immune system, obesity, etc.)     No 10. VACCINE: "Have you had the COVID-19 vaccine?" If Yes, ask: "Which one, how many shots, when did you get it?"       No 11. BOOSTER: "Have you received your COVID-19 booster?" If Yes, ask: "Which one and when did you get it?"       No 12. PREGNANCY: "Is there any chance you are pregnant?" "When was your last menstrual period?"       No 13. OTHER SYMPTOMS: "Do you have any other symptoms?"  (e.g., chills, fatigue, headache, loss of smell  or taste, muscle pain, sore throat)       Fatigue, aches, Ear pain - Left 14. O2 SATURATION MONITOR:  "Do you use an oxygen saturation monitor (pulse oximeter) at home?" If Yes, ask "What is your reading (oxygen level) today?" "What is your usual oxygen saturation reading?" (e.g., 95%)       No  Protocols used: Coronavirus (COVID-19) Diagnosed or Suspected-A-AH

## 2021-01-06 NOTE — Telephone Encounter (Signed)
Pt. Tested positive for COVID 19  - home test. Has left ear pain, body aches, fatigue,headache, vomited x 1. Virtual visit made for today.

## 2021-01-06 NOTE — Patient Instructions (Addendum)
Continue Mucinex DM  Rest, fluids and Tylenol as needed  Quarantine for at least 5 days and symptoms are improved.

## 2021-08-26 DIAGNOSIS — Z90710 Acquired absence of both cervix and uterus: Secondary | ICD-10-CM | POA: Insufficient documentation

## 2021-11-23 ENCOUNTER — Other Ambulatory Visit: Payer: Self-pay | Admitting: Internal Medicine

## 2021-11-23 DIAGNOSIS — K219 Gastro-esophageal reflux disease without esophagitis: Secondary | ICD-10-CM

## 2021-11-24 NOTE — Telephone Encounter (Signed)
Requested Prescriptions  Pending Prescriptions Disp Refills  . pantoprazole (PROTONIX) 40 MG tablet [Pharmacy Med Name: PANTOPRAZOLE SOD DR 40 MG TAB] 90 tablet 0    Sig: TAKE 1 TABLET BY MOUTH DAILY AT 6 AM     Gastroenterology: Proton Pump Inhibitors Passed - 11/23/2021  3:47 AM      Passed - Valid encounter within last 12 months    Recent Outpatient Visits          10 months ago COVID   [facility] Reubin Milan, MD   12 months ago Annual physical exam   [facility] Reubin Milan, MD   3 years ago Annual physical exam   [facility] Reubin Milan, MD   3 years ago Epidermoid cyst of skin of ear   [facility] Reubin Milan, MD   4 years ago Annual physical exam   [facility] Reubin Milan, MD      Future Appointments            In 4 months Judithann Graves Nyoka Cowden, MD [facility], [facility]

## 2021-11-28 ENCOUNTER — Encounter: Payer: Managed Care, Other (non HMO) | Admitting: Internal Medicine

## 2021-11-29 IMAGING — US US BREAST*L* LIMITED INC AXILLA
1 series · 4 of 4 positions shown · non-contrast
Comparison: Previous exam(s).

CLINICAL DATA: 45-year-old female presenting for annual bilateral
mammogram and delayed follow-up of a probably benign right breast
mass first evaluated in 0990. Additionally, the patient reports a
palpable lump in the upper left breast.

EXAM:
DIGITAL DIAGNOSTIC BILATERAL MAMMOGRAM WITH TOMOSYNTHESIS AND CAD;
ULTRASOUND LEFT BREAST LIMITED
TECHNIQUE: Bilateral digital diagnostic mammography and breast tomosynthesis
was performed. The images were evaluated with computer-aided
detection.; Targeted ultrasound examination of the left breast was
performed

[Series 1: us breast*left* limited inc axilla · 0.09mm/px · 4 of 4 slices shown]
[im 1/4]
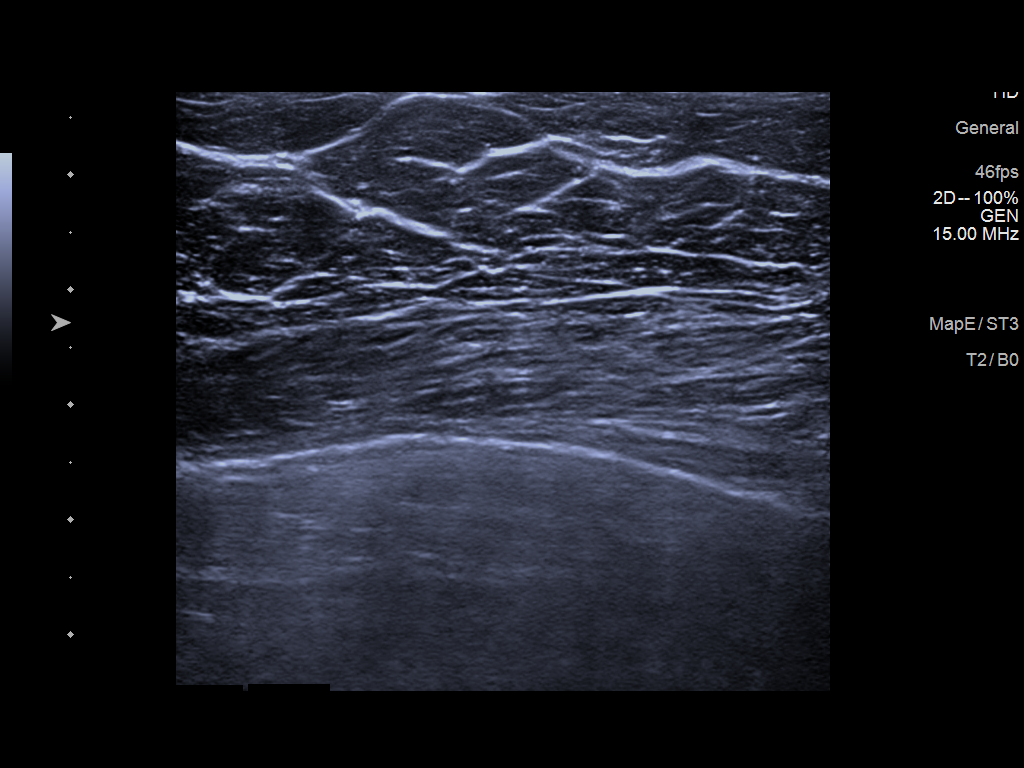
[im 2/4]
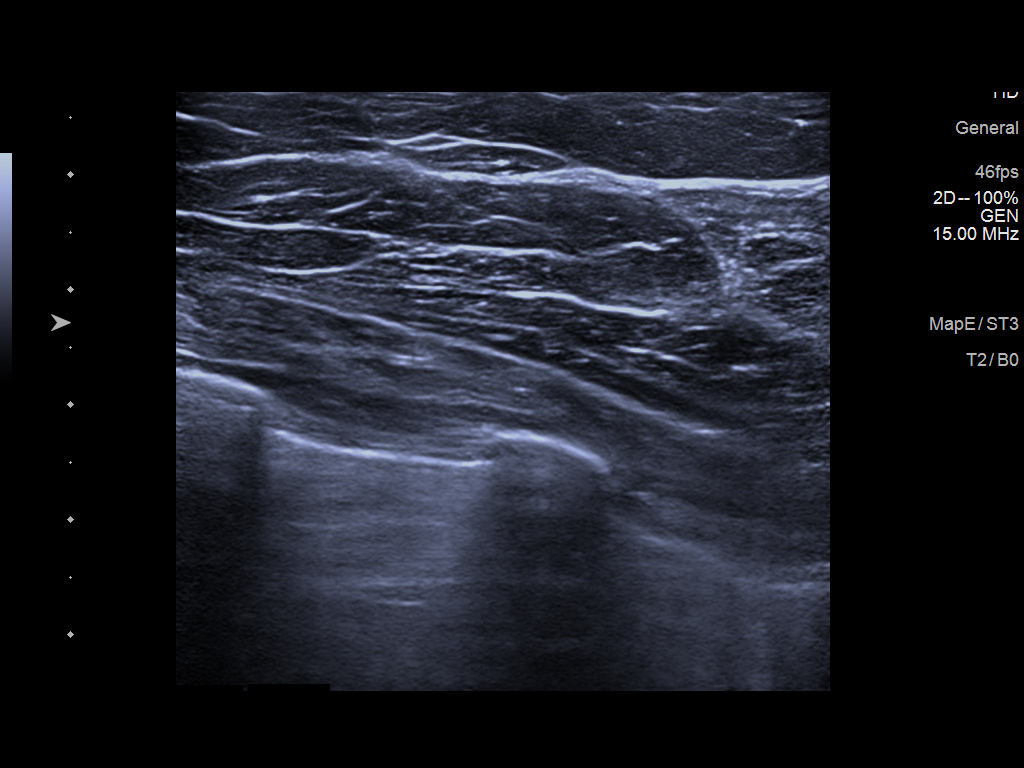
[im 3/4]
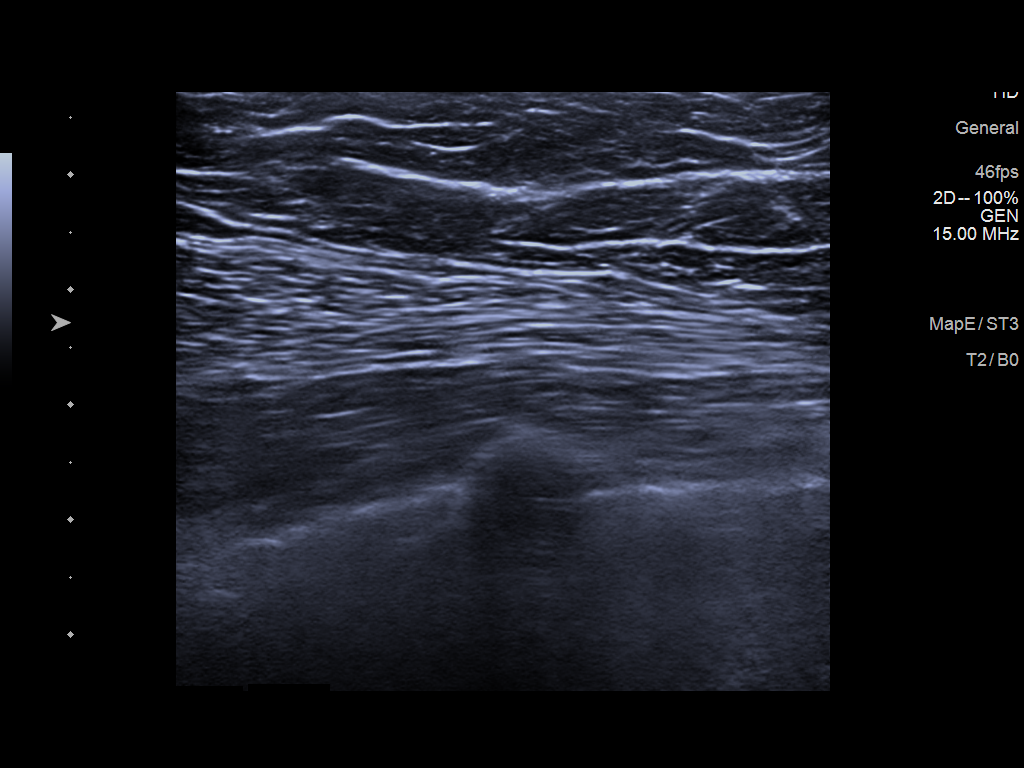
[im 4/4]
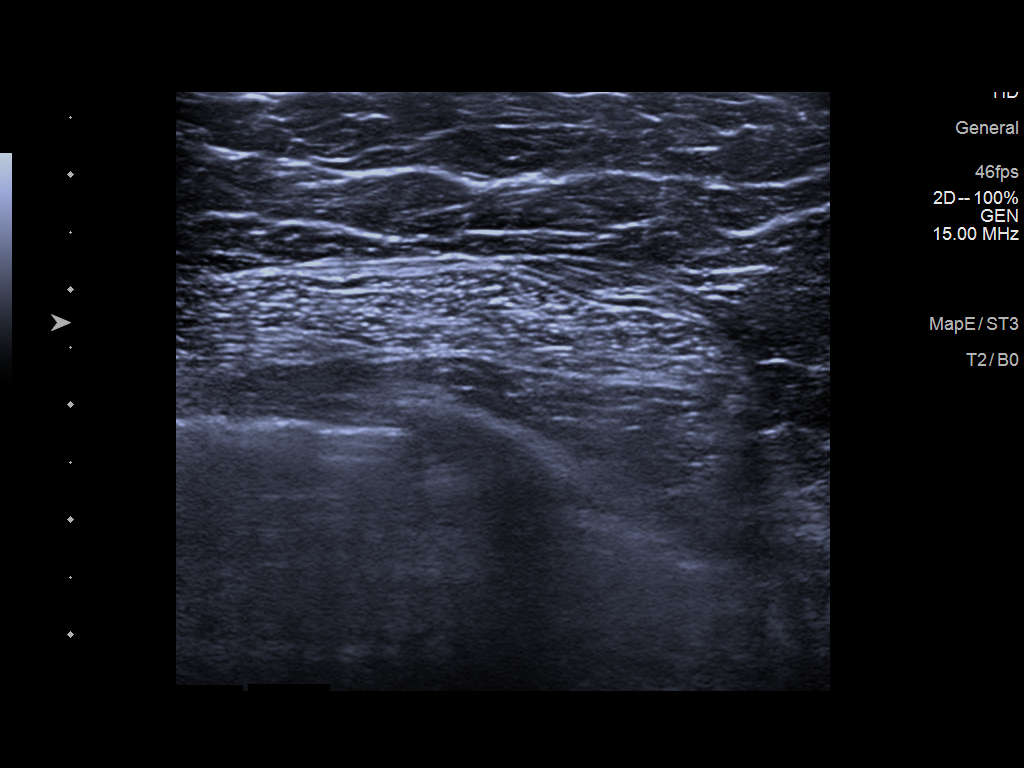

[4 of 4 positions shown; findings below may reference images not displayed]

ACR Breast Density Category b: There are scattered areas of
fibroglandular density.
FINDINGS: A circumscribed mass in the upper outer quadrant of the right breast
is mammographically stable in consistent with a benign etiology.
Radiopaque BB in the upper left breast at the site of the patient's
palpable lump demonstrates no suspicious findings. Otherwise, no
focal or suspicious findings within either breast.

Targeted ultrasound is performed, showing fibroglandular tissue
without focal or suspicious sonographic abnormality. Evaluation
along 12 o'clock axis was performed.
IMPRESSION: 1. No mammographic evidence of malignancy in either breast.
2. Benign right breast mass demonstrating greater than 2 year
stability. No further imaging follow-up required.
3. No suspicious sonographic findings at the site of the patient's
left breast palpable lump. Recommendation is for clinical and
symptomatic follow-up.

RECOMMENDATION:
Screening mammogram in one year.(Code:H8-9-A2S)

I have discussed the findings and recommendations with the patient.
If applicable, a reminder letter will be sent to the patient
regarding the next appointment.

BI-RADS CATEGORY  2: Benign.

## 2022-02-25 ENCOUNTER — Other Ambulatory Visit: Payer: Self-pay | Admitting: Internal Medicine

## 2022-02-25 DIAGNOSIS — K219 Gastro-esophageal reflux disease without esophagitis: Secondary | ICD-10-CM

## 2022-02-25 NOTE — Telephone Encounter (Signed)
Patient has future OV scheduled, will refill medication. Patient needs OV for additional refills.  Requested Prescriptions  Pending Prescriptions Disp Refills  . pantoprazole (PROTONIX) 40 MG tablet [Pharmacy Med Name: PANTOPRAZOLE SOD DR 40 MG TAB] 90 tablet 0    Sig: TAKE 1 TABLET BY MOUTH DAILY AT 6 AM     Gastroenterology: Proton Pump Inhibitors Failed - 02/25/2022  2:12 AM      Failed - Valid encounter within last 12 months    Recent Outpatient Visits          1 year ago [place] [facility] at [facility], Jesse Sans, MD   1 year ago Annual physical exam   [place] [facility] at [facility], Jesse Sans, MD   3 years ago Annual physical exam   [place] [facility] at [facility], Jesse Sans, MD   3 years ago Epidermoid cyst of skin of ear   [place] [facility] at [facility], Jesse Sans, MD   4 years ago Annual physical exam   [facility] at [facility], Jesse Sans, MD      Future Appointments            In 3 weeks Army Melia Jesse Sans, MD [place] [facility] at [facility], [facility]

## 2022-03-23 ENCOUNTER — Encounter: Payer: Self-pay | Admitting: Internal Medicine

## 2022-03-23 NOTE — Progress Notes (Signed)
Date:  03/24/2022   Name:  Amber Adkins   DOB:  1975/11/01   MRN:  [id number]   Chief Complaint: Annual Exam Amber Adkins is a 46 y.o. female who presents today for her Complete Annual Exam. She feels well. She reports exercising. She reports she is sleeping well. Breast complaints - none.  Mammogram: 11/2020 DEXA: none Pap smear: discontinued Colonoscopy: none - due  Health Maintenance Due  Topic Date Due   COLONOSCOPY (Pts 45-4yrs Insurance coverage will need to be confirmed)  Never done   MAMMOGRAM  12/02/2021    Immunization History  Administered Date(s) Administered   Tdap 06/08/2012    Gastroesophageal Reflux She reports no abdominal pain, no chest pain, no coughing or no wheezing. Pertinent negatives include no fatigue. She has tried a PPI for the symptoms.  Hyperlipidemia This is a chronic problem. The problem is uncontrolled. Recent lipid tests were reviewed and are high. Pertinent negatives include no chest pain or shortness of breath. She is currently on no antihyperlipidemic treatment.    Lab Results  Component Value Date   NA 141 11/25/2020   K 4.2 11/25/2020   CO2 25 11/25/2020   GLUCOSE 95 11/25/2020   BUN 10 11/25/2020   CREATININE 0.80 11/25/2020   CALCIUM 9.8 11/25/2020   EGFR 93 11/25/2020   GFRNONAA 110 10/27/2018   Lab Results  Component Value Date   CHOL 225 (H) 11/25/2020   HDL 71 11/25/2020   LDLCALC 140 (H) 11/25/2020   TRIG 78 11/25/2020   CHOLHDL 3.2 11/25/2020   Lab Results  Component Value Date   TSH 1.030 10/27/2018   Lab Results  Component Value Date   HGBA1C 5.8 (H) 12/13/2014   Lab Results  Component Value Date   WBC 4.5 11/25/2020   HGB 11.7 11/25/2020   HCT 35.2 11/25/2020   MCV 85 11/25/2020   PLT 311 11/25/2020   Lab Results  Component Value Date   ALT 16 11/25/2020   AST 16 11/25/2020   ALKPHOS 78 11/25/2020   BILITOT 1.6 (H) 11/25/2020   No results found for: "25OHVITD2",  "25OHVITD3", "VD25OH"   Review of Systems  Constitutional:  Negative for chills, fatigue and fever.  HENT:  Negative for congestion, hearing loss, tinnitus, trouble swallowing and voice change.   Eyes:  Negative for visual disturbance.  Respiratory:  Negative for cough, chest tightness, shortness of breath and wheezing.   Cardiovascular:  Negative for chest pain, palpitations and leg swelling.  Gastrointestinal:  Negative for abdominal pain, constipation, diarrhea and vomiting.  Endocrine: Negative for polydipsia and polyuria.  Genitourinary:  Negative for dysuria, frequency, genital sores, vaginal bleeding and vaginal discharge.  Musculoskeletal:  Negative for arthralgias, gait problem and joint swelling.  Skin:  Negative for color change and rash.  Neurological:  Negative for dizziness, tremors, light-headedness and headaches.  Hematological:  Negative for adenopathy. Does not bruise/bleed easily.  Psychiatric/Behavioral:  Negative for dysphoric mood and sleep disturbance. The patient is not nervous/anxious.     Patient Active Problem List   Diagnosis Date Noted   S/P TAH (total abdominal hysterectomy) 08/26/2021   Epidermoid cyst of skin of ear 05/12/2018   Gastro-esophageal reflux disease without esophagitis 07/23/2015   Status post bariatric surgery 04/24/2015   Mixed hyperlipidemia 03/20/2015   Tobacco use disorder, mild, in sustained remission 09/27/2014    Allergies  Allergen Reactions   Tape Hives    Surgical tape and surgical glue    Past Surgical  History:  Procedure Laterality Date   ABDOMINAL HYSTERECTOMY  2010   total   CHOLECYSTECTOMY, LAPAROSCOPIC  03/04/2016   LAPAROSCOPIC GASTRIC SLEEVE RESECTION  04/2015    Social History   Tobacco Use   Smoking status: Former    Packs/day: 2.00    Years: 15.00    Total pack years: 30.00    Types: Cigarettes    Quit date: 06/08/2013    Years since quitting: 8.7   Smokeless tobacco: Never  Vaping Use   Vaping Use:  Never used  Substance Use Topics   Alcohol use: Yes    Alcohol/week: 0.0 standard drinks of alcohol    Comment: social   Drug use: No     Medication list has been reviewed and updated.  Current Meds  Medication Sig   estradiol (ESTRACE) 1 MG tablet TAKE 1 TABLET BY MOUTH  DAILY   pantoprazole (PROTONIX) 40 MG tablet TAKE 1 TABLET BY MOUTH DAILY AT 6 AM       03/24/2022    9:58 AM 01/06/2021   10:39 AM 11/25/2020    8:44 AM  GAD 7 : Generalized Anxiety Score  Nervous, Anxious, on Edge 0 0 0  Control/stop worrying 0 0 0  Worry too much - different things 0 0 0  Trouble relaxing 0 0 0  Restless 0 0 0  Easily annoyed or irritable 0 0 0  Afraid - awful might happen 0 0 0  Total GAD 7 Score 0 0 0  Anxiety Difficulty Not difficult at all  Not difficult at all       03/24/2022    9:58 AM 01/06/2021   10:38 AM 11/25/2020    8:43 AM  Depression screen PHQ 2/9  Decreased Interest 0 0 0  Down, Depressed, Hopeless 0 0 0  PHQ - 2 Score 0 0 0  Altered sleeping 0 0 0  Tired, decreased energy 0 0 0  Change in appetite 0 0 0  Feeling bad or failure about yourself  0 0 0  Trouble concentrating 0 0 0  Moving slowly or fidgety/restless 0 0 0  Suicidal thoughts 0 0 0  PHQ-9 Score 0 0 0  Difficult doing work/chores Not difficult at all Not difficult at all Not difficult at all    BP Readings from Last 3 Encounters:  03/24/22 112/68  11/25/20 106/70  10/27/18 108/76    Physical Exam Vitals and nursing note reviewed.  Constitutional:      General: She is not in acute distress.    Appearance: She is well-developed.  HENT:     Head: Normocephalic and atraumatic.     Right Ear: Tympanic membrane and ear canal normal.     Left Ear: Tympanic membrane and ear canal normal.     Nose:     Right Sinus: No maxillary sinus tenderness.     Left Sinus: No maxillary sinus tenderness.  Eyes:     General: No scleral icterus.       Right eye: No discharge.        Left eye: No discharge.      Conjunctiva/sclera: Conjunctivae normal.  Neck:     Thyroid: No thyromegaly.     Vascular: No carotid bruit.  Cardiovascular:     Rate and Rhythm: Normal rate and regular rhythm.     Pulses: Normal pulses.     Heart sounds: Normal heart sounds.  Pulmonary:     Effort: Pulmonary effort is normal. No respiratory distress.  Breath sounds: No wheezing.  Chest:  Breasts:    Right: No mass, nipple discharge, skin change or tenderness.     Left: No mass, nipple discharge, skin change or tenderness.  Abdominal:     General: Bowel sounds are normal.     Palpations: Abdomen is soft.     Tenderness: There is no abdominal tenderness.  Musculoskeletal:     Cervical back: Normal range of motion. No erythema.     Right lower leg: No edema.     Left lower leg: No edema.  Lymphadenopathy:     Cervical: No cervical adenopathy.  Skin:    General: Skin is warm and dry.     Findings: No rash.  Neurological:     Mental Status: She is alert and oriented to person, place, and time.     Cranial Nerves: No cranial nerve deficit.     Sensory: No sensory deficit.     Deep Tendon Reflexes: Reflexes are normal and symmetric.  Psychiatric:        Attention and Perception: Attention normal.        Mood and Affect: Mood normal.     Wt Readings from Last 3 Encounters:  03/24/22 167 lb (75.8 kg)  11/25/20 180 lb (81.6 kg)  10/27/18 176 lb (79.8 kg)    BP 112/68   Pulse 74   Ht 5\' 6"  (1.676 m)   Wt 167 lb (75.8 kg)   SpO2 97%   BMI 26.95 kg/m   Assessment and Plan: 1. Annual physical exam Normal exam Excellent weight loss with lifestyle changes - CBC with Differential/Platelet - Comprehensive metabolic panel - Hemoglobin A1c - Lipid panel  2. Encounter for screening mammogram for breast cancer Due for annual screening At higher risk due to family hx - MM 3D SCREEN BREAST BILATERAL  3. Colon cancer screening Deferred to age 27  4. Gastro-esophageal reflux disease without  esophagitis Symptoms well controlled on daily PPI No red flag signs such as weight loss, n/v, melena Will continue pantoprazole - CBC with Differential/Platelet - pantoprazole (PROTONIX) 40 MG tablet; Take 1 tablet (40 mg total) by mouth daily.  Dispense: 90 tablet; Refill: 3  5. Mixed hyperlipidemia Check labs and advise - Lipid panel  6. Prediabetes Continue diet and exercise for weight control - Hemoglobin A1c   Partially dictated using Animal nutritionist. Any errors are unintentional.  Bari Edward, MD [facility]  03/24/2022

## 2022-03-24 ENCOUNTER — Encounter: Payer: Self-pay | Admitting: Internal Medicine

## 2022-03-24 ENCOUNTER — Ambulatory Visit (INDEPENDENT_AMBULATORY_CARE_PROVIDER_SITE_OTHER): Payer: Managed Care, Other (non HMO) | Admitting: Internal Medicine

## 2022-03-24 VITALS — BP 112/68 | HR 74 | Ht 66.0 in | Wt 167.0 lb

## 2022-03-24 DIAGNOSIS — K219 Gastro-esophageal reflux disease without esophagitis: Secondary | ICD-10-CM | POA: Diagnosis not present

## 2022-03-24 DIAGNOSIS — Z0000 Encounter for general adult medical examination without abnormal findings: Secondary | ICD-10-CM | POA: Diagnosis not present

## 2022-03-24 DIAGNOSIS — Z1231 Encounter for screening mammogram for malignant neoplasm of breast: Secondary | ICD-10-CM

## 2022-03-24 DIAGNOSIS — Z1211 Encounter for screening for malignant neoplasm of colon: Secondary | ICD-10-CM

## 2022-03-24 DIAGNOSIS — R7303 Prediabetes: Secondary | ICD-10-CM

## 2022-03-24 DIAGNOSIS — E782 Mixed hyperlipidemia: Secondary | ICD-10-CM | POA: Diagnosis not present

## 2022-03-24 MED ORDER — PANTOPRAZOLE SODIUM 40 MG PO TBEC: 40.0000 mg | DELAYED_RELEASE_TABLET | Freq: Every day | ORAL | 3 refills | Status: DC

## 2022-03-24 NOTE — Patient Instructions (Signed)
Call [facility] to schedule your mammogram at [phone].  

## 2022-03-25 ENCOUNTER — Encounter: Payer: Self-pay | Admitting: Internal Medicine

## 2022-03-25 LAB — LIPID PANEL
Chol/HDL Ratio: 3.2 ratio (ref 0.0–4.4)
Cholesterol, Total: 252 mg/dL — ABNORMAL HIGH (ref 100–199)
HDL: 79 mg/dL (ref >39)
LDL Chol Calc (NIH): 157 mg/dL — ABNORMAL HIGH (ref 0–99)
Triglycerides: 92 mg/dL (ref 0–149)
VLDL Cholesterol Cal: 16 mg/dL (ref 5–40)

## 2022-03-25 LAB — CBC WITH DIFFERENTIAL/PLATELET
Basophils Absolute: 0 10*3/uL (ref 0.0–0.2)
Basos: 1 %
EOS (ABSOLUTE): 0.1 10*3/uL (ref 0.0–0.4)
Eos: 1 %
Hematocrit: 38.3 % (ref 34.0–46.6)
Hemoglobin: 13 g/dL (ref 11.1–15.9)
Immature Grans (Abs): 0 10*3/uL (ref 0.0–0.1)
Immature Granulocytes: 0 %
Lymphocytes Absolute: 1.3 10*3/uL (ref 0.7–3.1)
Lymphs: 34 %
MCH: 29.6 pg (ref 26.6–33.0)
MCHC: 33.9 g/dL (ref 31.5–35.7)
MCV: 87 fL (ref 79–97)
Monocytes Absolute: 0.3 10*3/uL (ref 0.1–0.9)
Monocytes: 9 %
Neutrophils Absolute: 2.1 10*3/uL (ref 1.4–7.0)
Neutrophils: 55 %
Platelets: 309 10*3/uL (ref 150–450)
RBC: 4.39 x10E6/uL (ref 3.77–5.28)
RDW: 12.5 % (ref 11.7–15.4)
WBC: 3.7 10*3/uL (ref 3.4–10.8)

## 2022-03-25 LAB — COMPREHENSIVE METABOLIC PANEL
ALT: 17 IU/L (ref 0–32)
AST: 16 IU/L (ref 0–40)
Albumin/Globulin Ratio: 1.7 (ref 1.2–2.2)
Albumin: 4.5 g/dL (ref 3.9–4.9)
Alkaline Phosphatase: 86 IU/L (ref 44–121)
BUN/Creatinine Ratio: 8 — ABNORMAL LOW (ref 9–23)
BUN: 7 mg/dL (ref 6–24)
Bilirubin Total: 1.4 mg/dL — ABNORMAL HIGH (ref 0.0–1.2)
CO2: 24 mmol/L (ref 20–29)
Calcium: 9.9 mg/dL (ref 8.7–10.2)
Chloride: 103 mmol/L (ref 96–106)
Creatinine, Ser: 0.87 mg/dL (ref 0.57–1.00)
Globulin, Total: 2.6 g/dL (ref 1.5–4.5)
Glucose: 97 mg/dL (ref 70–99)
Potassium: 4.4 mmol/L (ref 3.5–5.2)
Sodium: 142 mmol/L (ref 134–144)
Total Protein: 7.1 g/dL (ref 6.0–8.5)
eGFR: 84 mL/min/{1.73_m2} (ref >59)

## 2022-03-25 LAB — HEMOGLOBIN A1C
Est. average glucose Bld gHb Est-mCnc: 114 mg/dL
Hgb A1c MFr Bld: 5.6 % (ref 4.8–5.6)

## 2022-03-25 NOTE — Telephone Encounter (Signed)
Please review. Form is in your box.  KP

## 2022-08-07 ENCOUNTER — Encounter: Payer: Self-pay | Admitting: Internal Medicine

## 2022-08-19 ENCOUNTER — Other Ambulatory Visit: Payer: Self-pay | Admitting: Internal Medicine

## 2022-08-19 DIAGNOSIS — K219 Gastro-esophageal reflux disease without esophagitis: Secondary | ICD-10-CM

## 2022-08-19 NOTE — Telephone Encounter (Signed)
Medication Refill - Medication: pantoprazole (PROTONIX) 40 MG tablet  Has the patient contacted their pharmacy? yes (Agent: If no, request that the patient contact the pharmacy for the refill. If patient does not wish to contact the pharmacy document the reason why and proceed with request.) (Agent: If yes, when and what did the pharmacy advise?)contact pcp  Preferred Pharmacy (with phone number or street name): [place], [place] Phone: [phone]  Fax: [phone]     Has the patient been seen for an appointment in the last year OR does the patient have an upcoming appointment? yes  Agent: Please be advised that RX refills may take up to 3 business days. We ask that you follow-up with your pharmacy.

## 2022-08-20 MED ORDER — PANTOPRAZOLE SODIUM 40 MG PO TBEC: 40.0000 mg | DELAYED_RELEASE_TABLET | Freq: Every day | ORAL | 1 refills | Status: DC

## 2022-08-20 NOTE — Telephone Encounter (Signed)
Change in pharmacy- remainder of Rx forwarded to new pharmacy Requested Prescriptions  Pending Prescriptions Disp Refills   pantoprazole (PROTONIX) 40 MG tablet 90 tablet 1    Sig: Take 1 tablet (40 mg total) by mouth daily.     Gastroenterology: Proton Pump Inhibitors Passed - 08/19/2022  5:38 PM      Passed - Valid encounter within last 12 months    Recent Outpatient Visits           4 months ago Annual physical exam   [facility] at [facility], Jesse Sans, MD   1 year ago [place] at [facility], Jesse Sans, MD   1 year ago Annual physical exam   [place] [facility], Jesse Sans, MD   3 years ago Annual physical exam   [facility] at [facility], Jesse Sans, MD   4 years ago Epidermoid cyst of skin of ear   [place] at [facility], Jesse Sans, MD       Future Appointments             In 7 months Army Melia, Jesse Sans, MD [place] at [facility], [facility]

## 2022-09-28 ENCOUNTER — Ambulatory Visit
Admission: RE | Admit: 2022-09-28 | Discharge: 2022-09-28 | Disposition: A | Discharge status: Home / Self Care | Payer: Managed Care, Other (non HMO) | Source: Ambulatory Visit | Attending: Internal Medicine | Admitting: Internal Medicine

## 2022-09-28 DIAGNOSIS — Z1231 Encounter for screening mammogram for malignant neoplasm of breast: Secondary | ICD-10-CM | POA: Diagnosis present

## 2023-02-05 ENCOUNTER — Other Ambulatory Visit: Payer: Self-pay | Admitting: Internal Medicine

## 2023-02-05 DIAGNOSIS — K219 Gastro-esophageal reflux disease without esophagitis: Secondary | ICD-10-CM

## 2023-02-05 NOTE — Telephone Encounter (Signed)
Requested Prescriptions  Pending Prescriptions Disp Refills   pantoprazole (PROTONIX) 40 MG tablet [Pharmacy Med Name: PANTOPRAZOLE TAB 40MG ] 90 tablet 1    Sig: TAKE 1 TABLET BY MOUTH DAILY.     Gastroenterology: Proton Pump Inhibitors Passed - 02/05/2023  9:21 AM      Passed - Valid encounter within last 12 months    Recent Outpatient Visits           10 months ago Annual physical exam   [facility] at [facility], Nyoka Cowden, MD   2 years ago COVID   [facility] at [facility], Nyoka Cowden, MD   2 years ago Annual physical exam   [facility] at [facility], Nyoka Cowden, MD   4 years ago Annual physical exam   [facility] at [facility], Nyoka Cowden, MD   4 years ago Epidermoid cyst of skin of ear   [facility] at [facility], Nyoka Cowden, MD       Future Appointments             In 1 month Judithann Graves, Nyoka Cowden, MD [facility] at [facility], [facility]

## 2023-03-25 ENCOUNTER — Encounter: Payer: Self-pay | Admitting: Internal Medicine

## 2023-03-29 ENCOUNTER — Encounter: Payer: Self-pay | Admitting: Internal Medicine

## 2023-03-29 ENCOUNTER — Ambulatory Visit (INDEPENDENT_AMBULATORY_CARE_PROVIDER_SITE_OTHER): Payer: Managed Care, Other (non HMO) | Admitting: Internal Medicine

## 2023-03-29 VITALS — BP 112/78 | HR 79 | Ht 66.0 in | Wt 171.0 lb

## 2023-03-29 DIAGNOSIS — Z1231 Encounter for screening mammogram for malignant neoplasm of breast: Secondary | ICD-10-CM

## 2023-03-29 DIAGNOSIS — Z1211 Encounter for screening for malignant neoplasm of colon: Secondary | ICD-10-CM | POA: Diagnosis not present

## 2023-03-29 DIAGNOSIS — Z9884 Bariatric surgery status: Secondary | ICD-10-CM

## 2023-03-29 DIAGNOSIS — Z131 Encounter for screening for diabetes mellitus: Secondary | ICD-10-CM

## 2023-03-29 DIAGNOSIS — Z0000 Encounter for general adult medical examination without abnormal findings: Secondary | ICD-10-CM

## 2023-03-29 DIAGNOSIS — K219 Gastro-esophageal reflux disease without esophagitis: Secondary | ICD-10-CM

## 2023-03-29 DIAGNOSIS — E782 Mixed hyperlipidemia: Secondary | ICD-10-CM

## 2023-03-29 NOTE — Assessment & Plan Note (Addendum)
Reflux symptoms are minimal on current therapy - pantoprazole prn. No red flag signs such as weight loss, n/v, melena

## 2023-03-29 NOTE — Assessment & Plan Note (Addendum)
Low 10 yr risk - low fat diet and exercise recommended. Significant positive changes made.

## 2023-03-29 NOTE — Progress Notes (Signed)
Date:  03/29/2023   Name:  Amber Adkins   DOB:  1975-12-09   MRN:  [id number]   Chief Complaint: Annual Exam Amber Adkins is a 47 y.o. female who presents today for her Complete Annual Exam. She feels well. She reports exercising. She reports she is sleeping well. Breast complaints - none.  She has changed her diet - eating home grown vegetables, limiting fats and eating out, exercising regularly.  Mammogram: 09/2022 DEXA: none Colonoscopy: none Pap: discontinued TAH  Health Maintenance Due  Topic Date Due   HIV Screening  Never done   Colonoscopy  Never done   DTaP/Tdap/Td (2 - Td or Tdap) 06/08/2022   COVID-19 Vaccine (1 - 2023-24 season) Never done    Immunization History  Administered Date(s) Administered   Tdap 06/08/2012    Gastroesophageal Reflux She complains of heartburn. She reports no abdominal pain, no chest pain, no coughing or no wheezing. This is a recurrent problem. The problem occurs rarely. Pertinent negatives include no fatigue. She has tried a PPI for the symptoms. The treatment provided significant relief.    Review of Systems  Constitutional:  Negative for chills, fatigue and fever.  HENT:  Negative for congestion, hearing loss, tinnitus, trouble swallowing and voice change.   Eyes:  Negative for visual disturbance.  Respiratory:  Negative for cough, chest tightness, shortness of breath and wheezing.   Cardiovascular:  Negative for chest pain, palpitations and leg swelling.  Gastrointestinal:  Positive for heartburn. Negative for abdominal pain, constipation, diarrhea and vomiting.  Endocrine: Negative for polydipsia and polyuria.  Genitourinary:  Negative for dysuria, frequency, genital sores, vaginal bleeding and vaginal discharge.  Musculoskeletal:  Negative for arthralgias, gait problem and joint swelling.  Skin:  Negative for color change and rash.  Neurological:  Negative for dizziness, tremors, light-headedness and  headaches.  Hematological:  Negative for adenopathy. Does not bruise/bleed easily.  Psychiatric/Behavioral:  Negative for dysphoric mood and sleep disturbance. The patient is not nervous/anxious.      Lab Results  Component Value Date   NA 142 03/24/2022   K 4.4 03/24/2022   CO2 24 03/24/2022   GLUCOSE 97 03/24/2022   BUN 7 03/24/2022   CREATININE 0.87 03/24/2022   CALCIUM 9.9 03/24/2022   EGFR 84 03/24/2022   GFRNONAA 110 10/27/2018   Lab Results  Component Value Date   CHOL 252 (H) 03/24/2022   HDL 79 03/24/2022   LDLCALC 157 (H) 03/24/2022   TRIG 92 03/24/2022   CHOLHDL 3.2 03/24/2022   Lab Results  Component Value Date   TSH 1.030 10/27/2018   Lab Results  Component Value Date   HGBA1C 5.6 03/24/2022   Lab Results  Component Value Date   WBC 3.7 03/24/2022   HGB 13.0 03/24/2022   HCT 38.3 03/24/2022   MCV 87 03/24/2022   PLT 309 03/24/2022   Lab Results  Component Value Date   ALT 17 03/24/2022   AST 16 03/24/2022   ALKPHOS 86 03/24/2022   BILITOT 1.4 (H) 03/24/2022   No results found for: "25OHVITD2", "25OHVITD3", "VD25OH"   Patient Active Problem List   Diagnosis Date Noted   S/P TAH (total abdominal hysterectomy) 08/26/2021   Epidermoid cyst of skin of ear 05/12/2018   Gastro-esophageal reflux disease without esophagitis 07/23/2015   Status post bariatric surgery 04/24/2015   Mixed hyperlipidemia 03/20/2015   Tobacco use disorder, mild, in sustained remission 09/27/2014    Allergies  Allergen Reactions   Tape Hives  Surgical tape and surgical glue    Past Surgical History:  Procedure Laterality Date   ABDOMINAL HYSTERECTOMY  2010   total   CHOLECYSTECTOMY, LAPAROSCOPIC  03/04/2016   LAPAROSCOPIC GASTRIC SLEEVE RESECTION  04/2015    Social History   Tobacco Use   Smoking status: Former    Current packs/day: 0.00    Average packs/day: 2.0 packs/day for 15.0 years (30.0 ttl pk-yrs)    Types: Cigarettes    Start date: 06/08/1998     Quit date: 06/08/2013    Years since quitting: 9.8   Smokeless tobacco: Never  Vaping Use   Vaping status: Never Used  Substance Use Topics   Alcohol use: Yes    Alcohol/week: 0.0 standard drinks of alcohol    Comment: social   Drug use: No     Medication list has been reviewed and updated.  Current Meds  Medication Sig   pantoprazole (PROTONIX) 40 MG tablet TAKE 1 TABLET BY MOUTH DAILY.   [DISCONTINUED] estradiol (ESTRACE) 1 MG tablet TAKE 1 TABLET BY MOUTH  DAILY       03/29/2023    9:23 AM 03/24/2022    9:58 AM 01/06/2021   10:39 AM 11/25/2020    8:44 AM  GAD 7 : Generalized Anxiety Score  Nervous, Anxious, on Edge 0 0 0 0  Control/stop worrying 0 0 0 0  Worry too much - different things 0 0 0 0  Trouble relaxing 0 0 0 0  Restless 0 0 0 0  Easily annoyed or irritable 0 0 0 0  Afraid - awful might happen 0 0 0 0  Total GAD 7 Score 0 0 0 0  Anxiety Difficulty Not difficult at all Not difficult at all  Not difficult at all       03/29/2023    9:23 AM 03/24/2022    9:58 AM 01/06/2021   10:38 AM  Depression screen PHQ 2/9  Decreased Interest 0 0 0  Down, Depressed, Hopeless 0 0 0  PHQ - 2 Score 0 0 0  Altered sleeping 0 0 0  Tired, decreased energy 0 0 0  Change in appetite 0 0 0  Feeling bad or failure about yourself  0 0 0  Trouble concentrating 0 0 0  Moving slowly or fidgety/restless 0 0 0  Suicidal thoughts 0 0 0  PHQ-9 Score 0 0 0  Difficult doing work/chores Not difficult at all Not difficult at all Not difficult at all    BP Readings from Last 3 Encounters:  03/29/23 112/78  03/24/22 112/68  11/25/20 106/70    Physical Exam Vitals and nursing note reviewed.  Constitutional:      General: She is not in acute distress.    Appearance: She is well-developed.  HENT:     Head: Normocephalic and atraumatic.     Right Ear: Tympanic membrane and ear canal normal.     Left Ear: Tympanic membrane and ear canal normal.     Nose:     Right Sinus: No  maxillary sinus tenderness.     Left Sinus: No maxillary sinus tenderness.  Eyes:     General: No scleral icterus.       Right eye: No discharge.        Left eye: No discharge.     Conjunctiva/sclera: Conjunctivae normal.  Neck:     Thyroid: No thyromegaly.     Vascular: No carotid bruit.  Cardiovascular:     Rate and Rhythm: Normal rate  and regular rhythm.     Pulses: Normal pulses.     Heart sounds: Normal heart sounds.  Pulmonary:     Effort: Pulmonary effort is normal. No respiratory distress.     Breath sounds: No wheezing.  Abdominal:     General: Bowel sounds are normal.     Palpations: Abdomen is soft.     Tenderness: There is no abdominal tenderness.  Musculoskeletal:     Cervical back: Normal range of motion. No erythema.     Right lower leg: No edema.     Left lower leg: No edema.  Lymphadenopathy:     Cervical: No cervical adenopathy.  Skin:    General: Skin is warm and dry.     Findings: No rash.  Neurological:     General: No focal deficit present.     Mental Status: She is alert and oriented to person, place, and time.     Cranial Nerves: No cranial nerve deficit.     Sensory: No sensory deficit.     Deep Tendon Reflexes: Reflexes are normal and symmetric.  Psychiatric:        Attention and Perception: Attention normal.        Mood and Affect: Mood normal.     Wt Readings from Last 3 Encounters:  03/29/23 171 lb (77.6 kg)  03/24/22 167 lb (75.8 kg)  11/25/20 180 lb (81.6 kg)    BP 112/78   Pulse 79   Ht 5\' 6"  (1.676 m)   Wt 171 lb (77.6 kg)   SpO2 98%   BMI 27.60 kg/m   Assessment and Plan:  Problem List Items Addressed This Visit       Unprioritized   Gastro-esophageal reflux disease without esophagitis (Chronic)    Reflux symptoms are minimal on current therapy - pantoprazole prn. No red flag signs such as weight loss, n/v, melena       Relevant Orders   CBC with Differential/Platelet   Mixed hyperlipidemia (Chronic)    Low 10 yr  risk - low fat diet and exercise recommended. Significant positive changes made.      Relevant Orders   Lipid panel   Other Visit Diagnoses     Annual physical exam    -  Primary   Relevant Orders   CBC with Differential/Platelet   Comprehensive metabolic panel   Hemoglobin A1c   Lipid panel   TSH   Vitamin B12   Iron, TIBC and Ferritin Panel   Encounter for screening mammogram for breast cancer       Relevant Orders   MM 3D SCREENING MAMMOGRAM BILATERAL BREAST   Colon cancer screening       patient defers to age 35 due to lack of sx and fam hx   Screening for diabetes mellitus       Relevant Orders   Comprehensive metabolic panel   Hemoglobin A1c   Gastric bypass status for obesity       Relevant Orders   Vitamin B12   Iron, TIBC and Ferritin Panel       Return in about 1 year (around 03/28/2024) for CPX.    Reubin Milan, MD [facility] [place]

## 2023-03-29 NOTE — Patient Instructions (Signed)
Call [facility] to schedule your mammogram in April 2025 at [phone].

## 2023-03-30 LAB — IRON,TIBC AND FERRITIN PANEL
Ferritin: 26 ng/mL (ref 15–150)
Iron Saturation: 21 % (ref 15–55)
Iron: 99 ug/dL (ref 27–159)
Total Iron Binding Capacity: 461 ug/dL — ABNORMAL HIGH (ref 250–450)
UIBC: 362 ug/dL (ref 131–425)

## 2023-03-30 LAB — LIPID PANEL
Chol/HDL Ratio: 2.9 ratio (ref 0.0–4.4)
Cholesterol, Total: 245 mg/dL — ABNORMAL HIGH (ref 100–199)
HDL: 84 mg/dL (ref >39)
LDL Chol Calc (NIH): 149 mg/dL — ABNORMAL HIGH (ref 0–99)
Triglycerides: 70 mg/dL (ref 0–149)
VLDL Cholesterol Cal: 12 mg/dL (ref 5–40)

## 2023-03-30 LAB — CBC WITH DIFFERENTIAL/PLATELET
Basophils Absolute: 0 10*3/uL (ref 0.0–0.2)
Basos: 0 %
EOS (ABSOLUTE): 0 10*3/uL (ref 0.0–0.4)
Eos: 1 %
Hematocrit: 38.9 % (ref 34.0–46.6)
Hemoglobin: 13.1 g/dL (ref 11.1–15.9)
Immature Grans (Abs): 0 10*3/uL (ref 0.0–0.1)
Immature Granulocytes: 0 %
Lymphocytes Absolute: 1.5 10*3/uL (ref 0.7–3.1)
Lymphs: 32 %
MCH: 30.4 pg (ref 26.6–33.0)
MCHC: 33.7 g/dL (ref 31.5–35.7)
MCV: 90 fL (ref 79–97)
Monocytes Absolute: 0.3 10*3/uL (ref 0.1–0.9)
Monocytes: 6 %
Neutrophils Absolute: 2.9 10*3/uL (ref 1.4–7.0)
Neutrophils: 61 %
Platelets: 353 10*3/uL (ref 150–450)
RBC: 4.31 x10E6/uL (ref 3.77–5.28)
RDW: 12.6 % (ref 11.7–15.4)
WBC: 4.8 10*3/uL (ref 3.4–10.8)

## 2023-03-30 LAB — COMPREHENSIVE METABOLIC PANEL
ALT: 16 [IU]/L (ref 0–32)
AST: 17 [IU]/L (ref 0–40)
Albumin: 4.5 g/dL (ref 3.9–4.9)
Alkaline Phosphatase: 81 [IU]/L (ref 44–121)
BUN/Creatinine Ratio: 13 (ref 9–23)
BUN: 11 mg/dL (ref 6–24)
Bilirubin Total: 1.5 mg/dL — ABNORMAL HIGH (ref 0.0–1.2)
CO2: 25 mmol/L (ref 20–29)
Calcium: 9.8 mg/dL (ref 8.7–10.2)
Chloride: 102 mmol/L (ref 96–106)
Creatinine, Ser: 0.84 mg/dL (ref 0.57–1.00)
Globulin, Total: 2 g/dL (ref 1.5–4.5)
Glucose: 97 mg/dL (ref 70–99)
Potassium: 4 mmol/L (ref 3.5–5.2)
Sodium: 140 mmol/L (ref 134–144)
Total Protein: 6.5 g/dL (ref 6.0–8.5)
eGFR: 87 mL/min/{1.73_m2} (ref >59)

## 2023-03-30 LAB — VITAMIN B12: Vitamin B-12: 684 pg/mL (ref 232–1245)

## 2023-03-30 LAB — TSH: TSH: 0.926 u[IU]/mL (ref 0.450–4.500)

## 2023-03-30 LAB — HEMOGLOBIN A1C
Est. average glucose Bld gHb Est-mCnc: 111 mg/dL
Hgb A1c MFr Bld: 5.5 % (ref 4.8–5.6)

## 2024-03-29 ENCOUNTER — Encounter: Payer: Self-pay | Admitting: Internal Medicine
